# Patient Record
Sex: Female | Born: 1960 | Race: White | Hispanic: No | Marital: Married | Smoking: Never smoker
Health system: Southern US, Community
[De-identification: ages and names within clinical notes are randomized; demographics above are authoritative.]

## PROBLEM LIST (undated history)

## (undated) DIAGNOSIS — T7840XA Allergy, unspecified, initial encounter: Secondary | ICD-10-CM

## (undated) DIAGNOSIS — R921 Mammographic calcification found on diagnostic imaging of breast: Secondary | ICD-10-CM

## (undated) DIAGNOSIS — H269 Unspecified cataract: Secondary | ICD-10-CM

## (undated) DIAGNOSIS — R011 Cardiac murmur, unspecified: Secondary | ICD-10-CM

## (undated) DIAGNOSIS — I341 Nonrheumatic mitral (valve) prolapse: Secondary | ICD-10-CM

## (undated) DIAGNOSIS — K219 Gastro-esophageal reflux disease without esophagitis: Secondary | ICD-10-CM

## (undated) DIAGNOSIS — H409 Unspecified glaucoma: Secondary | ICD-10-CM

## (undated) DIAGNOSIS — I471 Supraventricular tachycardia: Secondary | ICD-10-CM

## (undated) HISTORY — DX: Supraventricular tachycardia: I47.1

## (undated) HISTORY — PX: CATARACT EXTRACTION: SUR2

## (undated) HISTORY — PX: EYE SURGERY: SHX253

## (undated) HISTORY — DX: Allergy, unspecified, initial encounter: T78.40XA

## (undated) HISTORY — DX: Gastro-esophageal reflux disease without esophagitis: K21.9

## (undated) HISTORY — DX: Cardiac murmur, unspecified: R01.1

## (undated) HISTORY — DX: Nonrheumatic mitral (valve) prolapse: I34.1

## (undated) HISTORY — DX: Mammographic calcification found on diagnostic imaging of breast: R92.1

## (undated) HISTORY — DX: Unspecified glaucoma: H40.9

## (undated) HISTORY — PX: BREAST SURGERY: SHX581

## (undated) HISTORY — DX: Unspecified cataract: H26.9

---

## 1988-05-20 HISTORY — PX: CHOLECYSTECTOMY: SHX55

## 1990-05-20 HISTORY — PX: WISDOM TOOTH EXTRACTION: SHX21

## 2003-04-27 ENCOUNTER — Encounter: Admission: RE | Admit: 2003-04-27 | Discharge: 2003-04-27 | Payer: Self-pay | Admitting: Family Medicine

## 2003-06-17 ENCOUNTER — Other Ambulatory Visit: Admission: RE | Admit: 2003-06-17 | Discharge: 2003-06-17 | Payer: Self-pay | Admitting: Family Medicine

## 2006-01-30 ENCOUNTER — Other Ambulatory Visit: Admission: RE | Admit: 2006-01-30 | Discharge: 2006-01-30 | Payer: Self-pay | Admitting: Family Medicine

## 2007-06-08 ENCOUNTER — Encounter: Admission: RE | Admit: 2007-06-08 | Discharge: 2007-06-08 | Payer: Self-pay | Admitting: Family Medicine

## 2010-05-23 ENCOUNTER — Other Ambulatory Visit
Admission: RE | Admit: 2010-05-23 | Discharge: 2010-05-23 | Payer: Self-pay | Source: Home / Self Care | Admitting: Family Medicine

## 2012-05-25 ENCOUNTER — Other Ambulatory Visit: Payer: Self-pay | Admitting: Family Medicine

## 2012-05-25 DIAGNOSIS — Z1231 Encounter for screening mammogram for malignant neoplasm of breast: Secondary | ICD-10-CM

## 2012-05-27 ENCOUNTER — Ambulatory Visit
Admission: RE | Admit: 2012-05-27 | Discharge: 2012-05-27 | Disposition: A | Discharge status: Home / Self Care | Payer: BC Managed Care – PPO | Source: Ambulatory Visit | Attending: Family Medicine | Admitting: Family Medicine

## 2012-05-27 DIAGNOSIS — Z1231 Encounter for screening mammogram for malignant neoplasm of breast: Secondary | ICD-10-CM

## 2012-05-28 ENCOUNTER — Other Ambulatory Visit: Payer: Self-pay | Admitting: Family Medicine

## 2012-05-28 DIAGNOSIS — R928 Other abnormal and inconclusive findings on diagnostic imaging of breast: Secondary | ICD-10-CM

## 2012-06-10 ENCOUNTER — Ambulatory Visit
Admission: RE | Admit: 2012-06-10 | Discharge: 2012-06-10 | Disposition: A | Discharge status: Home / Self Care | Payer: BC Managed Care – PPO | Source: Ambulatory Visit | Attending: Family Medicine | Admitting: Family Medicine

## 2012-06-10 DIAGNOSIS — R928 Other abnormal and inconclusive findings on diagnostic imaging of breast: Secondary | ICD-10-CM

## 2012-06-17 ENCOUNTER — Ambulatory Visit (INDEPENDENT_AMBULATORY_CARE_PROVIDER_SITE_OTHER): Payer: BC Managed Care – PPO | Admitting: Surgery

## 2012-06-17 ENCOUNTER — Encounter (INDEPENDENT_AMBULATORY_CARE_PROVIDER_SITE_OTHER): Payer: Self-pay | Admitting: Surgery

## 2012-06-17 VITALS — BP 128/74 | HR 80 | Temp 98.2°F | Resp 14 | Ht 65.5 in | Wt 109.0 lb

## 2012-06-17 DIAGNOSIS — R921 Mammographic calcification found on diagnostic imaging of breast: Secondary | ICD-10-CM

## 2012-06-17 DIAGNOSIS — R928 Other abnormal and inconclusive findings on diagnostic imaging of breast: Secondary | ICD-10-CM

## 2012-06-17 HISTORY — DX: Mammographic calcification found on diagnostic imaging of breast: R92.1

## 2012-06-17 NOTE — Progress Notes (Signed)
Patient ID: Heidi Aguilar, female   DOB: Jun 09, 1960, 52 y.o.   MRN: [id number]  Chief Complaint  Patient presents with  . Other    breast calcifications    HPI Heidi Aguilar is a 52 y.o. female.   HPI She is referred by Dr. Mila Palmer For evaluation of abnormal calcifications in the left breast. These were found on recent screening mammography. A stereotactic biopsy could not be performed because of the location. She has had no previous problems with her breasts and has had no history of biopsies. She denies nipple discharge. She is otherwise without complaints. There is no family history of breast cancer Past Medical History  Diagnosis Date  . GERD (gastroesophageal reflux disease)   . Breast calcifications     left breast  . MVP (mitral valve prolapse)     Past Surgical History  Procedure Date  . Cataract extraction     lt eye  . Cholecystectomy 1990  . Wisdom tooth extraction 1992    Family History  Problem Relation Age of Onset  . Heart disease Father   . Cancer Maternal Uncle     prostate  . Cancer Maternal Grandmother     pancreatic  . Cancer Paternal Grandfather     throat    Social History History  Substance Use Topics  . Smoking status: Never Smoker   . Smokeless tobacco: Not on file  . Alcohol Use: No    Allergies  Allergen Reactions  . Acetaminophen     Kidney pain and back pain  . Aspirin     GI discomfort  . Percocet (Oxycodone-Acetaminophen) Nausea Only    Current Outpatient Prescriptions  Medication Sig Dispense Refill  . Ascorbic Acid (VITAMIN C PO) Take by mouth.      . Cholecalciferol (VITAMIN D PO) Take by mouth.      . Coenzyme Q10 (COQ10 PO) Take by mouth.      . Multiple Vitamin (MULTIVITAMIN) capsule Take 1 capsule by mouth daily.      . ranitidine (ZANTAC) 75 MG tablet Take 75 mg by mouth 2 (two) times daily.      Satira Sark Johns Wort 150 MG CAPS Take by mouth.      . timolol (BETIMOL) 0.25 % ophthalmic solution 1-2 drops 2 (two)  times daily.      Marland Kitchen VITAMIN E PO Take by mouth.        Review of Systems Review of Systems  Constitutional: Positive for unexpected weight change. Negative for fever and chills.  HENT: Negative for hearing loss, congestion, sore throat, trouble swallowing and voice change.   Eyes: Negative for visual disturbance.  Respiratory: Negative for cough and wheezing.   Cardiovascular: Positive for palpitations. Negative for chest pain and leg swelling.  Gastrointestinal: Negative for nausea, vomiting, abdominal pain, diarrhea, constipation, blood in stool, abdominal distention and anal bleeding.  Genitourinary: Negative for hematuria, vaginal bleeding and difficulty urinating.  Musculoskeletal: Negative for arthralgias.  Skin: Negative for rash and wound.  Neurological: Negative for seizures, syncope and headaches.  Hematological: Negative for adenopathy. Does not bruise/bleed easily.  Psychiatric/Behavioral: Negative for confusion.    Blood pressure 128/74, pulse 80, temperature 98.2 F (36.8 C), temperature source Temporal, resp. rate 14, height 5' 5.5" (1.664 m), weight 109 lb (49.442 kg).  Physical Exam Physical Exam  Constitutional: She is oriented to person, place, and time. No distress.       Very thin  HENT:  Head: Normocephalic and atraumatic.  Right Ear: External ear normal.  Left Ear: External ear normal.  Nose: Nose normal.  Mouth/Throat: Oropharynx is clear and moist. No oropharyngeal exudate.  Eyes: Conjunctivae normal are normal. Pupils are equal, round, and reactive to light. Right eye exhibits no discharge. Left eye exhibits no discharge. No scleral icterus.  Neck: Normal range of motion. Neck supple. No tracheal deviation present.  Cardiovascular: Normal rate, regular rhythm and intact distal pulses.   Murmur heard. Pulmonary/Chest: Effort normal and breath sounds normal. No respiratory distress. She has no wheezes.  Abdominal: Soft. Bowel sounds are normal. She  exhibits no distension. There is no tenderness.       Well-healed right subcostal incision  Musculoskeletal: Normal range of motion. She exhibits no edema and no tenderness.  Lymphadenopathy:    She has no cervical adenopathy.    She has no axillary adenopathy.  Neurological: She is alert and oriented to person, place, and time.  Skin: Skin is warm and dry. No rash noted. She is not diaphoretic. No erythema.  Psychiatric: Her behavior is normal. Judgment normal.  Breasts: There are no palpable masses in either breast. Areola are normal.  Data Reviewed I have reviewed the mammograms demonstrating the abnormal calcifications in the upper outer quadrant of the left breast  Assessment    Abnormal calcifications left breast    Plan    Needle localized left breast lumpectomy is recommended. I have discussed this with her in detail including the reasoning for the biopsy. I discussed the risks of surgery which includes but is not limited to bleeding, infection, injury to surrounding structures, Need for further surgery should cancer be present, et Karie Soda.  She understands and wishes to proceed with surgery. Likelihood of success is good       Adianna Darwin A 06/17/2012, 9:29 AM

## 2012-06-29 ENCOUNTER — Encounter (HOSPITAL_BASED_OUTPATIENT_CLINIC_OR_DEPARTMENT_OTHER): Payer: Self-pay | Admitting: *Deleted

## 2012-07-01 ENCOUNTER — Encounter (HOSPITAL_BASED_OUTPATIENT_CLINIC_OR_DEPARTMENT_OTHER): Payer: Self-pay | Admitting: *Deleted

## 2012-07-02 ENCOUNTER — Ambulatory Visit (HOSPITAL_BASED_OUTPATIENT_CLINIC_OR_DEPARTMENT_OTHER): Admission: RE | Admit: 2012-07-02 | Payer: BC Managed Care – PPO | Source: Ambulatory Visit | Admitting: Surgery

## 2012-07-02 SURGERY — BREAST LUMPECTOMY WITH NEEDLE LOCALIZATION
Anesthesia: General | Site: Breast | Laterality: Left

## 2012-07-04 ENCOUNTER — Other Ambulatory Visit: Payer: Self-pay

## 2012-07-08 NOTE — Progress Notes (Signed)
Reviewed preop instructions

## 2012-07-13 ENCOUNTER — Encounter (HOSPITAL_BASED_OUTPATIENT_CLINIC_OR_DEPARTMENT_OTHER): Payer: Self-pay | Admitting: Anesthesiology

## 2012-07-13 ENCOUNTER — Other Ambulatory Visit (INDEPENDENT_AMBULATORY_CARE_PROVIDER_SITE_OTHER): Payer: Self-pay | Admitting: Surgery

## 2012-07-13 ENCOUNTER — Ambulatory Visit (HOSPITAL_BASED_OUTPATIENT_CLINIC_OR_DEPARTMENT_OTHER): Admission: RE | Admit: 2012-07-13 | Payer: BC Managed Care – PPO | Source: Ambulatory Visit | Admitting: Surgery

## 2012-07-13 ENCOUNTER — Ambulatory Visit
Admission: RE | Admit: 2012-07-13 | Discharge: 2012-07-13 | Disposition: A | Discharge status: Home / Self Care | Payer: BC Managed Care – PPO | Source: Ambulatory Visit | Attending: Surgery | Admitting: Surgery

## 2012-07-13 DIAGNOSIS — R921 Mammographic calcification found on diagnostic imaging of breast: Secondary | ICD-10-CM

## 2012-07-13 SURGERY — BREAST LUMPECTOMY WITH NEEDLE LOCALIZATION
Anesthesia: General | Laterality: Left

## 2012-07-13 NOTE — H&P (Signed)
Patient ID: Heidi Aguilar, female DOB: 07-19-1960, 52 y.o. MRN: [id number]  Chief Complaint   Patient presents with   .  Other     breast calcifications   HPI  Heidi Aguilar is a 52 y.o. female.  HPI  She is referred by Dr. Mila Palmer For evaluation of abnormal calcifications in the left breast. These were found on recent screening mammography. A stereotactic biopsy could not be performed because of the location. She has had no previous problems with her breasts and has had no history of biopsies. She denies nipple discharge. She is otherwise without complaints. There is no family history of breast cancer  Past Medical History   Diagnosis  Date   .  GERD (gastroesophageal reflux disease)    .  Breast calcifications      left breast   .  MVP (mitral valve prolapse)     Past Surgical History   Procedure  Date   .  Cataract extraction      lt eye   .  Cholecystectomy  1990   .  Wisdom tooth extraction  1992    Family History   Problem  Relation  Age of Onset   .  Heart disease  Father    .  Cancer  Maternal Uncle       prostate    .  Cancer  Maternal Grandmother       pancreatic    .  Cancer  Paternal Grandfather       throat   Social History  History   Substance Use Topics   .  Smoking status:  Never Smoker   .  Smokeless tobacco:  Not on file   .  Alcohol Use:  No    Allergies   Allergen  Reactions   .  Acetaminophen      Kidney pain and back pain   .  Aspirin      GI discomfort   .  Percocet (Oxycodone-Acetaminophen)  Nausea Only    Current Outpatient Prescriptions   Medication  Sig  Dispense  Refill   .  Ascorbic Acid (VITAMIN C PO)  Take by mouth.     .  Cholecalciferol (VITAMIN D PO)  Take by mouth.     .  Coenzyme Q10 (COQ10 PO)  Take by mouth.     .  Multiple Vitamin (MULTIVITAMIN) capsule  Take 1 capsule by mouth daily.     .  ranitidine (ZANTAC) 75 MG tablet  Take 75 mg by mouth 2 (two) times daily.     Satira Sark Johns Wort 150 MG CAPS  Take by  mouth.     .  timolol (BETIMOL) 0.25 % ophthalmic solution  1-2 drops 2 (two) times daily.     Marland Kitchen  VITAMIN E PO  Take by mouth.     Review of Systems  Review of Systems  Constitutional: Positive for unexpected weight change. Negative for fever and chills.  HENT: Negative for hearing loss, congestion, sore throat, trouble swallowing and voice change.  Eyes: Negative for visual disturbance.  Respiratory: Negative for cough and wheezing.  Cardiovascular: Positive for palpitations. Negative for chest pain and leg swelling.  Gastrointestinal: Negative for nausea, vomiting, abdominal pain, diarrhea, constipation, blood in stool, abdominal distention and anal bleeding.  Genitourinary: Negative for hematuria, vaginal bleeding and difficulty urinating.  Musculoskeletal: Negative for arthralgias.  Skin: Negative for rash and wound.  Neurological: Negative for seizures, syncope and headaches.  Hematological: Negative for adenopathy. Does not bruise/bleed easily.  Psychiatric/Behavioral: Negative for confusion.  Blood pressure 128/74, pulse 80, temperature 98.2 F (36.8 C), temperature source Temporal, resp. rate 14, height 5' 5.5" (1.664 m), weight 109 lb (49.442 kg).  Physical Exam  Physical Exam  Constitutional: She is oriented to person, place, and time. No distress.  Very thin  HENT:  Head: Normocephalic and atraumatic.  Right Ear: External ear normal.  Left Ear: External ear normal.  Nose: Nose normal.  Mouth/Throat: Oropharynx is clear and moist. No oropharyngeal exudate.  Eyes: Conjunctivae normal are normal. Pupils are equal, round, and reactive to light. Right eye exhibits no discharge. Left eye exhibits no discharge. No scleral icterus.  Neck: Normal range of motion. Neck supple. No tracheal deviation present.  Cardiovascular: Normal rate, regular rhythm and intact distal pulses.  Murmur heard.  Pulmonary/Chest: Effort normal and breath sounds normal. No respiratory distress. She has  no wheezes.  Abdominal: Soft. Bowel sounds are normal. She exhibits no distension. There is no tenderness.  Well-healed right subcostal incision  Musculoskeletal: Normal range of motion. She exhibits no edema and no tenderness.  Lymphadenopathy:  She has no cervical adenopathy.  She has no axillary adenopathy.  Neurological: She is alert and oriented to person, place, and time.  Skin: Skin is warm and dry. No rash noted. She is not diaphoretic. No erythema.  Psychiatric: Her behavior is normal. Judgment normal.  Breasts: There are no palpable masses in either breast. Areola are normal.  Data Reviewed  I have reviewed the mammograms demonstrating the abnormal calcifications in the upper outer quadrant of the left breast  Assessment  Abnormal calcifications left breast  Plan  Needle localized left breast lumpectomy is recommended. I have discussed this with her in detail including the reasoning for the biopsy. I discussed the risks of surgery which includes but is not limited to bleeding, infection, injury to surrounding structures, Need for further surgery should cancer be present, et Karie Soda. She understands and wishes to proceed with surgery. Likelihood of success is good

## 2012-07-13 NOTE — Consult Note (Signed)
  Lesion was actually able to have stereotactic biopsy today instead of needle loc so surgery is canceled

## 2012-07-14 ENCOUNTER — Telehealth (INDEPENDENT_AMBULATORY_CARE_PROVIDER_SITE_OTHER): Payer: Self-pay | Admitting: General Surgery

## 2012-07-14 NOTE — Telephone Encounter (Signed)
Spoke with patient , Patient states she did not need surgery no appt was  Made .

## 2013-03-25 ENCOUNTER — Other Ambulatory Visit: Payer: Self-pay

## 2013-06-14 ENCOUNTER — Other Ambulatory Visit: Payer: Self-pay | Admitting: Family Medicine

## 2013-06-14 ENCOUNTER — Other Ambulatory Visit (HOSPITAL_COMMUNITY)
Admission: RE | Admit: 2013-06-14 | Discharge: 2013-06-14 | Disposition: A | Discharge status: Home / Self Care | Payer: BC Managed Care – PPO | Source: Ambulatory Visit | Attending: Family Medicine | Admitting: Family Medicine

## 2013-06-14 DIAGNOSIS — Z124 Encounter for screening for malignant neoplasm of cervix: Secondary | ICD-10-CM | POA: Insufficient documentation

## 2015-10-02 ENCOUNTER — Other Ambulatory Visit: Payer: Self-pay

## 2015-10-02 DIAGNOSIS — Z1231 Encounter for screening mammogram for malignant neoplasm of breast: Secondary | ICD-10-CM

## 2015-10-12 ENCOUNTER — Ambulatory Visit
Admission: RE | Admit: 2015-10-12 | Discharge: 2015-10-12 | Disposition: A | Discharge status: Home / Self Care | Payer: BC Managed Care – PPO | Source: Ambulatory Visit

## 2015-10-12 DIAGNOSIS — Z1231 Encounter for screening mammogram for malignant neoplasm of breast: Secondary | ICD-10-CM

## 2017-01-06 ENCOUNTER — Other Ambulatory Visit (HOSPITAL_COMMUNITY)
Admission: RE | Admit: 2017-01-06 | Discharge: 2017-01-06 | Disposition: A | Discharge status: Home / Self Care | Payer: BC Managed Care – PPO | Source: Ambulatory Visit | Attending: Family Medicine | Admitting: Family Medicine

## 2017-01-06 ENCOUNTER — Other Ambulatory Visit: Payer: Self-pay | Admitting: Family Medicine

## 2017-01-06 DIAGNOSIS — Z01411 Encounter for gynecological examination (general) (routine) with abnormal findings: Secondary | ICD-10-CM | POA: Diagnosis not present

## 2017-01-07 LAB — CYTOLOGY - PAP
Diagnosis: NEGATIVE
HPV (WINDOPATH): NOT DETECTED

## 2017-01-26 ENCOUNTER — Emergency Department (HOSPITAL_COMMUNITY): Payer: BC Managed Care – PPO

## 2017-01-26 ENCOUNTER — Encounter (HOSPITAL_COMMUNITY): Payer: Self-pay | Admitting: *Deleted

## 2017-01-26 DIAGNOSIS — I341 Nonrheumatic mitral (valve) prolapse: Secondary | ICD-10-CM | POA: Diagnosis not present

## 2017-01-26 DIAGNOSIS — Z885 Allergy status to narcotic agent status: Secondary | ICD-10-CM | POA: Insufficient documentation

## 2017-01-26 DIAGNOSIS — R921 Mammographic calcification found on diagnostic imaging of breast: Secondary | ICD-10-CM | POA: Insufficient documentation

## 2017-01-26 DIAGNOSIS — I471 Supraventricular tachycardia: Principal | ICD-10-CM | POA: Insufficient documentation

## 2017-01-26 DIAGNOSIS — K219 Gastro-esophageal reflux disease without esophagitis: Secondary | ICD-10-CM | POA: Diagnosis not present

## 2017-01-26 DIAGNOSIS — I493 Ventricular premature depolarization: Secondary | ICD-10-CM | POA: Insufficient documentation

## 2017-01-26 DIAGNOSIS — R002 Palpitations: Secondary | ICD-10-CM | POA: Diagnosis present

## 2017-01-26 LAB — CBC
HCT: 40.4 % (ref 36.0–46.0)
HEMOGLOBIN: 13.2 g/dL (ref 12.0–15.0)
MCH: 31 pg (ref 26.0–34.0)
MCHC: 32.7 g/dL (ref 30.0–36.0)
MCV: 94.8 fL (ref 78.0–100.0)
PLATELETS: 189 10*3/uL (ref 150–400)
RBC: 4.26 MIL/uL (ref 3.87–5.11)
RDW: 12.5 % (ref 11.5–15.5)
WBC: 6.3 10*3/uL (ref 4.0–10.5)

## 2017-01-26 NOTE — ED Triage Notes (Signed)
The pt is c/o her heart racing earlier tonight and beating irregular.  No chest pain.  Hx of pvcs

## 2017-01-27 ENCOUNTER — Encounter (HOSPITAL_COMMUNITY): Payer: Self-pay | Admitting: Internal Medicine

## 2017-01-27 ENCOUNTER — Observation Stay (HOSPITAL_COMMUNITY)
Admission: EM | Admit: 2017-01-27 | Discharge: 2017-01-27 | Disposition: A | Discharge status: Home / Self Care | Payer: BC Managed Care – PPO | Attending: Internal Medicine | Admitting: Internal Medicine

## 2017-01-27 ENCOUNTER — Observation Stay (HOSPITAL_BASED_OUTPATIENT_CLINIC_OR_DEPARTMENT_OTHER): Payer: BC Managed Care – PPO

## 2017-01-27 ENCOUNTER — Other Ambulatory Visit: Payer: Self-pay

## 2017-01-27 DIAGNOSIS — I4719 Other supraventricular tachycardia: Secondary | ICD-10-CM

## 2017-01-27 DIAGNOSIS — R002 Palpitations: Secondary | ICD-10-CM

## 2017-01-27 DIAGNOSIS — I471 Supraventricular tachycardia: Secondary | ICD-10-CM

## 2017-01-27 HISTORY — DX: Other supraventricular tachycardia: I47.19

## 2017-01-27 HISTORY — DX: Supraventricular tachycardia: I47.1

## 2017-01-27 LAB — RAPID URINE DRUG SCREEN, HOSP PERFORMED
Amphetamines: NOT DETECTED
BARBITURATES: NOT DETECTED
Benzodiazepines: NOT DETECTED
Cocaine: NOT DETECTED
Opiates: NOT DETECTED
TETRAHYDROCANNABINOL: NOT DETECTED

## 2017-01-27 LAB — ECHOCARDIOGRAM COMPLETE
HEIGHTINCHES: 65.5 in
Weight: 1791.9 oz

## 2017-01-27 LAB — BASIC METABOLIC PANEL
Anion gap: 12 (ref 5–15)
BUN: 17 mg/dL (ref 6–20)
CHLORIDE: 100 mmol/L — AB (ref 101–111)
CO2: 24 mmol/L (ref 22–32)
Calcium: 9.5 mg/dL (ref 8.9–10.3)
Creatinine, Ser: 0.87 mg/dL (ref 0.44–1.00)
GFR calc Af Amer: >60 mL/min (ref >60)
GFR calc non Af Amer: >60 mL/min (ref >60)
GLUCOSE: 103 mg/dL — AB (ref 65–99)
POTASSIUM: 3.7 mmol/L (ref 3.5–5.1)
Sodium: 136 mmol/L (ref 135–145)

## 2017-01-27 LAB — MAGNESIUM: Magnesium: 2 mg/dL (ref 1.7–2.4)

## 2017-01-27 LAB — HIV ANTIBODY (ROUTINE TESTING W REFLEX): HIV Screen 4th Generation wRfx: NONREACTIVE

## 2017-01-27 LAB — TROPONIN I

## 2017-01-27 MED ORDER — HEPARIN SODIUM (PORCINE) 5000 UNIT/ML IJ SOLN: 5000.0000 [IU] | Freq: Three times a day (TID) | INTRAMUSCULAR | Status: DC

## 2017-01-27 MED ORDER — METOPROLOL TARTRATE 5 MG/5ML IV SOLN
5.0000 mg | Freq: Once | INTRAVENOUS | Status: AC
  Administered 2017-01-27: 5 mg via INTRAVENOUS
  Filled 2017-01-27: qty 5

## 2017-01-27 MED ORDER — ONDANSETRON HCL 4 MG/2ML IJ SOLN: 4.0000 mg | Freq: Four times a day (QID) | INTRAMUSCULAR | Status: DC | PRN

## 2017-01-27 MED ORDER — ONDANSETRON HCL 4 MG PO TABS: 4.0000 mg | ORAL_TABLET | Freq: Four times a day (QID) | ORAL | Status: DC | PRN

## 2017-01-27 NOTE — Progress Notes (Signed)
Patient admitted after midnight, please see H&P.  I have fixed patient's social history as she DOES NOT use cocaine.  Await cards consult.  Patient anxious to be d/c'd home as she is in observation status.  Marlin CanaryJessica Jetaime Pinnix DO

## 2017-01-27 NOTE — ED Provider Notes (Signed)
          Derwood KaplanNanavati, Nellene Courtois, MD 01/30/17 1743

## 2017-01-27 NOTE — Discharge Summary (Signed)
Physician Discharge Summary  Heidi Aguilar ZOX:[id number] DOB: 03/09/1961 DOA: 01/27/2017  PCP: Mila Palmer, MD  Admit date: 01/27/2017 Discharge date: 01/27/2017   Recommendations for Outpatient Follow-Up:   1. Outpatient EP follow up set up-- will need eval for marfans 2. Will need script for lopressor 25 mg q 6 hours PRN (saw recommendations after d/c)   Discharge Diagnosis:   Principal Problem:   Multifocal atrial tachycardia [facility])   Discharge disposition:  Home.  Discharge Condition: Improved.  Diet recommendation: Low sodium, heart healthy  Wound care: None.   History of Present Illness:   Heidi Aguilar is a 56 y.o. female with medical history significant of mild MVP back some 10 years ago.  About 2 years ago patient developed occasional palpitations.  She went to her PCP who saw a unifocal PVC on an EKG and she thought that's all she had going on.  Tonight however, she developed continuous palpitations while watching TV.  These persisted much more than they had in the past.  She presents to the ED wit palpitations.   Hospital Course by Problem:   Atrial Tach, -lopressor  q6 PRN for palpitations -echo: Left ventricle: The cavity size was normal. Wall thickness was   normal. Systolic function was normal. The estimated ejection   fraction was in the range of 60% to 65%. 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 Calcified annulus. -EP follow up   Medical Consultants:    EP   Discharge Exam:   Vitals:   01/27/17 0800 01/27/17 1442  BP: 116/66 110/65  Pulse: 72 80  Resp: 16 16  Temp: 98.4 F (36.9 C) 98.8 F (37.1 C)  SpO2: 100% 96%   Vitals:   01/27/17 0629 01/27/17 0630 01/27/17 0800 01/27/17 1442  BP: 129/66 117/71 116/66 110/65  Pulse: 70 69 72 80  Resp: Temp:   98.4 F (36.9 C) 98.8 F (37.1 C)  TempSrc:   Oral Oral  SpO2: 100% 98% 100% 96%  Weight:   50.8 kg (111 lb 15.9 oz)     Height:   5' 5.5" (1.664 m)     Gen:  NAD- anxious to be d/c'd   The results of significant diagnostics from this hospitalization (including imaging, microbiology, ancillary and laboratory) are listed below for reference.     Procedures and Diagnostic Studies:   No results found.   Labs:   Basic Metabolic Panel:  Recent Labs Lab 01/26/17 2330 01/27/17 1057  NA 136  --   K 3.7  --   CL 100*  --   CO2 24  --   GLUCOSE 103*  --   BUN 17  --   CREATININE 0.87  --   CALCIUM 9.5  --   MG  --  2.0   GFR Estimated Creatinine Clearance: 57.2 mL/min (by C-G formula based on SCr of 0.87 mg/dL). Liver Function Tests: No results for input(s): AST, ALT, ALKPHOS, BILITOT, PROT, ALBUMIN in the last 168 hours. No results for input(s): LIPASE, AMYLASE in the last 168 hours. No results for input(s): AMMONIA in the last 168 hours. Coagulation profile No results for input(s): INR, PROTIME in the last 168 hours.  CBC:  Recent Labs Lab 01/26/17 2330  WBC 6.3  HGB 13.2  HCT 40.4  MCV 94.8  PLT 189   Cardiac Enzymes:  Recent Labs Lab 01/26/17 2330  TROPONINI <0.03   BNP: Invalid input(s): POCBNP CBG:  No results for input(s): GLUCAP in the last 168 hours. D-Dimer No results for input(s): DDIMER in the last 72 hours. Hgb A1c No results for input(s): HGBA1C in the last 72 hours. Lipid Profile No results for input(s): CHOL, HDL, LDLCALC, TRIG, CHOLHDL, LDLDIRECT in the last 72 hours. Thyroid function studies No results for input(s): TSH, T4TOTAL, T3FREE, THYROIDAB in the last 72 hours.  Invalid input(s): FREET3 Anemia work up No results for input(s): VITAMINB12, FOLATE, FERRITIN, TIBC, IRON, RETICCTPCT in the last 72 hours. Microbiology No results found for this or any previous visit (from the past 240 hour(s)).   Discharge Instructions:   Discharge Instructions    Diet - low sodium heart healthy    Complete by:  As directed    Increase activity slowly     Complete by:  As directed      Allergies as of 01/27/2017      Reactions   Acetaminophen Other (See Comments)   Kidney pain and back pain   Aspirin Other (See Comments)   GI discomfort   Percocet [oxycodone-acetaminophen] Nausea Only   Cold sweats   Vicodin [hydrocodone-acetaminophen] Other (See Comments)   Bad dreams      Medication List    TAKE these medications   multivitamin capsule Take 1 capsule by mouth daily.   Omega-3 1000 MG Caps Take 1,000 mg by mouth daily.   PROBIOTIC PO Take 1 capsule by mouth daily.   VITAMIN D PO Take by mouth.            Discharge Care Instructions        Start     Ordered   01/27/17 0000  Increase activity slowly     01/27/17 1724   01/27/17 0000  Diet - low sodium heart healthy     01/27/17 1724     Follow-up Information    Sheilah PigeonUrsuy, Renee Lynn, PA-C Follow up on 02/21/2017.   Specialty:  Cardiology Why:  10:30AM Contact information: [address] [place] [address] [phone]        Mila PalmerWolters, Sharon, MD Follow up in 1 week(s).   Specialty:  Family Medicine Contact information: [address] [place] [address] [phone]            Time coordinating discharge: 35 min  Signed:  Joseph ArtJESSICA U Lela Murfin   [facility] 01/27/2017, 5:24 PM

## 2017-01-27 NOTE — Consult Note (Signed)
Cardiology Consultation:   Patient ID: Heidi Aguilar; [id number]; July 11, 1960   Admit date: 01/27/2017 Date of Consult: 01/27/2017  Primary Care Provider: Mila Palmer, MD Primary Cardiologist: new to [facility]  Patient Profile:   Heidi Aguilar. Schurman is a 56 y.o. female with a hx of GERD, reports MVP, no other known cardiac history or history at all who is being seen today for the evaluation of palpitations at the request of Dr. Benjamine Mola.  History of Present Illness:   Ms. Heidi Aguilar reports 4-5 years of occ palpitations/skipped beat type feelings she has been told were PVCs (by her PMD) and of no concern, they were infrequent and not bothersome to her and nothing more was done.  She reports since her teens being told she had MVP and has had a few echos over the years, always only mentioning MVP without any other abnormalities, the last perhaps 10 years or so ago.  She has never seen a cardiologist, and have been done via her PMD service.  Last night she developed unusual palpitations that were brief but more of a fluttering sensation with pressure on the sides of her neck when they would occur.  No CP or SOB, no dizziness, near syncope or syncope.  They persisted, always brief only seconds but recurrently, these were making her feel anxious and after about 45 minutes decided to get checked out.   LABS K+ 3.7 BUN/Creat 17/0.87 Trop I <0.03 WBC 6.3 H/H 13/40 plts 189  The patient was able to access her [place] records, showing me a TSH drawn 01/06/17 was 1.81  VSS   She was treated with  IV Lopressor in the ER, she did deep breathing exercises and tried to relax and has felt well since  She does not smoke, drink, no snoring or daytime fatigue/sleepiness, she exercises very routinely at least 30-31minutes most days, walking and aerobic type exercises with good exertional capacity, unchanged for years.  Past Medical History:  Diagnosis Date  . Breast calcifications    left breast  . GERD  (gastroesophageal reflux disease)   . MVP (mitral valve prolapse)     Past Surgical History:  Procedure Laterality Date  . CATARACT EXTRACTION     lt eye  . CHOLECYSTECTOMY  1990  . EYE SURGERY     lt eye age 21 and 6 cong cataracts  . WISDOM TOOTH EXTRACTION  1992       Inpatient Medications: Scheduled Meds: . heparin  5,000 Units Subcutaneous Q8H   Continuous Infusions:  PRN Meds: ondansetron **OR** ondansetron (ZOFRAN) IV  Allergies:    Allergies  Allergen Reactions  . Acetaminophen Other (See Comments)    Kidney pain and back pain  . Aspirin Other (See Comments)    GI discomfort  . Percocet [Oxycodone-Acetaminophen] Nausea Only    Cold sweats  . Vicodin [Hydrocodone-Acetaminophen] Other (See Comments)    Bad dreams    Social History:   Social History   Social History  . Marital status: Married    Spouse name: N/A  . Number of children: N/A  . Years of education: N/A   Occupational History  . Not on file.   Social History Main Topics  . Smoking status: Never Smoker  . Smokeless tobacco: Never Used  . Alcohol use No  . Drug use: No  . Sexual activity: Not on file   Other Topics Concern  . Not on file   Social History Narrative  . No narrative on file  Family History:    Family History  Problem Relation Age of Onset  . Heart disease Father   . Cancer Maternal Uncle        prostate  . Cancer Maternal Grandmother        pancreatic  . Cancer Paternal Grandfather        throat     ROS:  Please see the history of present illness.  ROS  All other ROS reviewed and negative.     Physical Exam/Data:   Vitals:   01/27/17 0459 01/27/17 0629 01/27/17 0630 01/27/17 0800  BP:  129/66 117/71 116/66  Pulse: 93 70 69 72  Resp: 12 11 12 16   Temp:    98.4 F (36.9 C)  TempSrc:    Oral  SpO2: 100% 100% 98% 100%  Weight:    111 lb 15.9 oz (50.8 kg)  Height:    5' 5.5" (1.664 m)   No intake or output data in the 24 hours ending 01/27/17  1151 Filed Weights   01/26/17 2328 01/27/17 0800  Weight: 118 lb (53.5 kg) 111 lb 15.9 oz (50.8 kg)   Body mass index is 18.35 kg/m.  General:  Well nourished, well developed, in no acute distress HEENT: normal Lymph: no adenopathy Neck: no JVD Endocrine:  No thryomegaly Vascular: No carotid bruits; FA pulses 2+ bilaterally without bruits  Cardiac:   RRR; no murmurs, gallops or rubs Lungs:  CTA b/l, no wheezing, rhonchi or rales  Abd: soft, nontende Ext: no edema Musculoskeletal:  No deformities, BUE and BLE strength normal and equal Skin: warm and dry  Neuro:  no focal abnormalities noted Psych:  Normal affect   EKG:  The EKG was personally reviewed and demonstrates:   SR 89bpm, PR 184ms, QRS 84ms, QTc 469ms, PACs, PVC Telemetry:  Telemetry was personally reviewed and demonstrates:   SR 70's-80's, brier episodes of tachycardi, anywhere from 3seconds to 10 seconds appears to be aflutter, atypical, at thimes more like an ATachycardia  Relevant CV Studies: Echo is ordered  Laboratory Data:  Chemistry Recent Labs Lab 01/26/17 2330  NA 136  K 3.7  CL 100*  CO2 24  GLUCOSE 103*  BUN 17  CREATININE 0.87  CALCIUM 9.5  GFRNONAA >60  GFRAA >60  ANIONGAP 12    No results for input(s): PROT, ALBUMIN, AST, ALT, ALKPHOS, BILITOT in the last 168 hours. Hematology Recent Labs Lab 01/26/17 2330  WBC 6.3  RBC 4.26  HGB 13.2  HCT 40.4  MCV 94.8  MCH 31.0  MCHC 32.7  RDW 12.5  PLT 189   Cardiac Enzymes Recent Labs Lab 01/26/17 2330  TROPONINI <0.03   No results for input(s): TROPIPOC in the last 168 hours.  BNPNo results for input(s): BNP, PROBNP in the last 168 hours.  DDimer No results for input(s): DDIMER in the last 168 hours.  Radiology/Studies:  Dg Chest 2 View Result Date: 01/26/2017 CLINICAL DATA:  Heart palpitations today. EXAM: CHEST  2 VIEW COMPARISON:  None. FINDINGS: The lungs are clear. Heart size is normal. There is no pneumothorax or pleural  fluid. No acute bony abnormality. Cholecystectomy clips noted. IMPRESSION: No acute disease. Electronically Signed   By: Drusilla Kannerhomas  Dalessio M.D.   On: 01/26/2017 23:48    Assessment and Plan:   1. Palpitations     I suspect  an ATach     CHA2DS2Vasc is 1 for female gender should it be fel to be a flutter once reviewed with Dr. Graciela HusbandsKlein  Await echo    Dr. Graciela Husbands has seen the patient, is ATach, after d/w the patient, will plan for lopressor  q6 PRN for palpitations, would be OK from EP standpoint to discharge after her echo is completed here.  EP follow up has been arranged.    For questions or updates, please contact [facility] Please consult [email or web address] for contact info under Cardiology/STEMI. Daytime calls, contact the Day Call APP (6a-8a) or assigned team (Teams A-D) provider (7:30a - 5p). All other daytime calls (7:30-5p), contact the Card Master @ [phone].   Nighttime calls, contact the assigned APP (5p-8p) or MD (6:30p-8p). Overnight calls (8p-6a), contact the on call Fellow @ [phone].   Signed, Sheilah Pigeon, PA-C  01/27/2017 11:51 AM   The patient has a long-standing history of palpitations. She was diagnosed with mitral valve prolapse in 1977. At that time she was also noted to have a pectus deformity. Palpitations were subsequently clarified as PVCs.  She's had some irregular flutters but last night developed incessant irregular flutters the likes of which she had never had.  She has some neck discomfort with it but no lightheadedness chest pain or shortness of breath. Upon arrival in the emergency room she received beta blockers and overtime things settled and they have returned now towards normal.  TSH was normal 8/18.   She does not use caffeine. She does not snore.  Her past history is also notable for congenital cataract? She denies a history of dislocated lenses.  Her physical examination is noted for arachnodactyly, a pectus deformity, joint  hypermobility.  ECGs were reviewed demonstrating short runs of nonsustained atrial tachycardia and infrequent interpolated PVC  Impression Atrial tachycardia  Arachnodactyly with joint hypermobility  Mitral valve prolapse by history  Pectus deformity.  Congenital cataracts    The patient has atrial tachycardia. At this juncture she is disinclined to undertake long-term therapy. As it is thus far been episodic we will support her idea of episodic therapy and would prescribe beta blockers for her to take as needed. In the event the frequency dictates alternative strategies, we have reviewed long-term beta blocker therapy as an option, antiarrhythmic drug therapy and catheter ablation. He is to be determined in follow-up.  Another issue is as to whether there has been any impact of her tachycardia on cardiac function. We will undertake an echocardiogram. We will also need to be able to well visualize her mitral valve but her aorta. The event that is not well visualize she will need more intense imaging with CT scanning or MRI scanning to look at this in the context of her other musculoskeletal deformities. We have noted also that in the event that the aortic dilatation were noted prescribe long-term beta blockers for aortic protection as well as secondary benefit on her atrial arrhythmia  We will need to further clarify the constellation of her musculoskeletal defects, specifically looking to exclude Marfan syndrome

## 2017-01-27 NOTE — H&P (Signed)
History and Physical    Marzella L. Aina ZOX:[id number] DOB: Sep 23, 1960 DOA: 01/27/2017  PCP: Mila Palmer, MD  Patient coming from: Home  I have personally briefly reviewed patient's old medical records in [facility] [address] [facility]  Chief Complaint: Palpitations  HPI: Vermell L. Schmall is a 56 y.o. female with medical history significant of mild MVP back some 10 years ago.  About 2 years ago patient developed occasional palpitations.  She went to her PCP who saw a unifocal PVC on an EKG and she thought that's all she had going on.  Tonight however, she developed continuous palpitations while watching TV.  These persisted much more than they had in the past.  She presents to the ED wit palpitations.   ED Course:       Resolved spontaneously and patient now in NSR.   Review of Systems: As per HPI otherwise 10 point review of systems negative.   Past Medical History:  Diagnosis Date  . Breast calcifications    left breast  . GERD (gastroesophageal reflux disease)   . MVP (mitral valve prolapse)     Past Surgical History:  Procedure Laterality Date  . CATARACT EXTRACTION     lt eye  . CHOLECYSTECTOMY  1990  . EYE SURGERY     lt eye age 42 and 6 cong cataracts  . WISDOM TOOTH EXTRACTION  1992     reports that she has never smoked. She has never used smokeless tobacco. She reports that she uses drugs, including Benzodiazepines and Cocaine. She reports that she does not drink alcohol.  Allergies  Allergen Reactions  . Acetaminophen Other (See Comments)    Kidney pain and back pain  . Aspirin Other (See Comments)    GI discomfort  . Percocet [Oxycodone-Acetaminophen] Nausea Only    Cold sweats  . Vicodin [Hydrocodone-Acetaminophen] Other (See Comments)    Bad dreams    Family History  Problem Relation Age of Onset  . Heart disease Father   . Cancer Maternal Uncle        prostate  . Cancer Maternal Grandmother        pancreatic  . Cancer Paternal Grandfather    throat     Prior to Admission medications   Medication Sig Start Date End Date Taking? Authorizing Provider  Cholecalciferol (VITAMIN D PO) Take by mouth.   Yes [provider]  Multiple Vitamin (MULTIVITAMIN) capsule Take 1 capsule by mouth daily.   Yes [provider]  Omega-3 1000 MG CAPS Take 1,000 mg by mouth daily.   Yes [provider]  Probiotic Product (PROBIOTIC PO) Take 1 capsule by mouth daily.   Yes [provider]    Physical Exam: Vitals:   01/27/17 0456 01/27/17 0457 01/27/17 0458 01/27/17 0459  BP:      Pulse: 91 93 94 93  Resp: Temp:      TempSrc:      SpO2: 99% 100% 99% 100%  Weight:      Height:        Constitutional: NAD, calm, comfortable Eyes: PERRL, lids and conjunctivae normal ENMT: Mucous membranes are moist. Posterior pharynx clear of any exudate or lesions.Normal dentition.  Neck: normal, supple, no masses, no thyromegaly Respiratory: clear to auscultation bilaterally, no wheezing, no crackles. Normal respiratory effort. No accessory muscle use.  Cardiovascular: Regular rate and rhythm, no murmurs / rubs / gallops. No extremity edema. 2+ pedal pulses. No carotid bruits.  Abdomen: no tenderness, no  masses palpated. No hepatosplenomegaly. Bowel sounds positive.  Musculoskeletal: no clubbing / cyanosis. No joint deformity upper and lower extremities. Good ROM, no contractures. Normal muscle tone.  Skin: no rashes, lesions, ulcers. No induration Neurologic: CN 2-12 grossly intact. Sensation intact, DTR normal. Strength 5/5 in all 4.  Psychiatric: Normal judgment and insight. Alert and oriented x 3. Normal mood.    Labs on Admission: I have personally reviewed following labs and imaging studies  CBC:  Recent Labs Lab 01/26/17 2330  WBC 6.3  HGB 13.2  HCT 40.4  MCV 94.8  PLT 189   Basic Metabolic Panel:  Recent Labs Lab 01/26/17 2330  NA 136  K 3.7  CL 100*  CO2 24  GLUCOSE 103*  BUN 17   CREATININE 0.87  CALCIUM 9.5   GFR: Estimated Creatinine Clearance: 60.3 mL/min (by C-G formula based on SCr of 0.87 mg/dL). Liver Function Tests: No results for input(s): AST, ALT, ALKPHOS, BILITOT, PROT, ALBUMIN in the last 168 hours. No results for input(s): LIPASE, AMYLASE in the last 168 hours. No results for input(s): AMMONIA in the last 168 hours. Coagulation Profile: No results for input(s): INR, PROTIME in the last 168 hours. Cardiac Enzymes:  Recent Labs Lab 01/26/17 2330  TROPONINI <0.03   BNP (last 3 results) No results for input(s): PROBNP in the last 8760 hours. HbA1C: No results for input(s): HGBA1C in the last 72 hours. CBG: No results for input(s): GLUCAP in the last 168 hours. Lipid Profile: No results for input(s): CHOL, HDL, LDLCALC, TRIG, CHOLHDL, LDLDIRECT in the last 72 hours. Thyroid Function Tests: No results for input(s): TSH, T4TOTAL, FREET4, T3FREE, THYROIDAB in the last 72 hours. Anemia Panel: No results for input(s): VITAMINB12, FOLATE, FERRITIN, TIBC, IRON, RETICCTPCT in the last 72 hours. Urine analysis: No results found for: COLORURINE, APPEARANCEUR, LABSPEC, PHURINE, GLUCOSEU, HGBUR, BILIRUBINUR, KETONESUR, PROTEINUR, UROBILINOGEN, NITRITE, LEUKOCYTESUR  Radiological Exams on Admission: Dg Chest 2 View  Result Date: 01/26/2017 CLINICAL DATA:  Heart palpitations today. EXAM: CHEST  2 VIEW COMPARISON:  None. FINDINGS: The lungs are clear. Heart size is normal. There is no pneumothorax or pleural fluid. No acute bony abnormality. Cholecystectomy clips noted. IMPRESSION: No acute disease. Electronically Signed   By: Drusilla Kannerhomas  Dalessio M.D.   On: 01/26/2017 23:48    EKG: Independently reviewed.  Assessment/Plan Principal Problem:   Multifocal atrial tachycardia (HCC)    1. EKG abnormality - Dr. Virgina OrganQureshi feels that this represents Multifocal atrial tachycardia - actually looks like shes going rapidly in and out of flutter 1. Resolved at this  time and she is in NSR and asymptomatic 2. However will get patient admitted for obs and cards consult.  DVT prophylaxis: Lovenox Code Status: Full Family Communication: Husband at bedside Disposition Plan: Home after admit Consults called: EDP spoke with Cards, cards consult in AM Admission status: Place in obs   Hillary BowGARDNER, Laurann Mcmorris M. DO [facility] Pager [phone]  If 7AM-7PM, please contact day team taking care of patient [email or web address] Password [email or web address]  01/27/2017, 6:15 AM

## 2017-01-27 NOTE — ED Provider Notes (Signed)
[facility]-EMERGENCY DEPT Provider Note   CSN: [id number] Arrival date & time: 01/26/17  2319     History   Chief Complaint Chief Complaint  Patient presents with  . Palpitations    HPI Heidi Aguilar is a 56 y.o. female.  Patient presents to the emergency department with chief complaint of palpitations. She states that the palpitations began this evening at 10:30 PM. She states she was at home resting watching TV. She reports that she has had PVCs in the past, but does not experience these regularly. She was seen by cardiologist about 10 years ago and diagnosed with mild mitral valve prolapse, she has not seen a cardiologist since. She reports tonight that she has only had the palpitations sensation. She denies any chest pain chest tightness, or shortness of breath. She has not taken anything for her symptoms. There are no other associated symptoms. There are no modifying factors. She denies having used any stimulants today.   The history is provided by the patient. No language interpreter was used.    Past Medical History:  Diagnosis Date  . Breast calcifications    left breast  . GERD (gastroesophageal reflux disease)   . MVP (mitral valve prolapse)     Patient Active Problem List   Diagnosis Date Noted  . Breast calcification, left 06/17/2012    Past Surgical History:  Procedure Laterality Date  . CATARACT EXTRACTION     lt eye  . CHOLECYSTECTOMY  1990  . EYE SURGERY     lt eye age 89 and 6 cong cataracts  . WISDOM TOOTH EXTRACTION  1992    OB History    No data available       Home Medications    Prior to Admission medications   Medication Sig Start Date End Date Taking? Authorizing Provider  Ascorbic Acid (VITAMIN C PO) Take by mouth.    [provider]  Cholecalciferol (VITAMIN D PO) Take by mouth.    [provider]  Coenzyme Q10 (COQ10 PO) Take by mouth.    [provider]  Multiple Vitamin (MULTIVITAMIN) capsule Take 1 capsule  by mouth daily.    [provider]  ranitidine (ZANTAC) 75 MG tablet Take 75 mg by mouth 2 (two) times daily.    [provider]  Spectrum Health Blodgett Campust Johns Wort 150 MG CAPS Take by mouth.    [provider]  timolol (BETIMOL) 0.25 % ophthalmic solution 1-2 drops 2 (two) times daily.    [provider]  VITAMIN E PO Take by mouth.    [provider]    Family History Family History  Problem Relation Age of Onset  . Heart disease Father   . Cancer Maternal Uncle        prostate  . Cancer Maternal Grandmother        pancreatic  . Cancer Paternal Grandfather        throat    Social History Social History  Substance Use Topics  . Smoking status: Never Smoker  . Smokeless tobacco: Never Used  . Alcohol use No     Allergies   Acetaminophen; Aspirin; and Percocet [oxycodone-acetaminophen]   Review of Systems Review of Systems  All other systems reviewed and are negative.    Physical Exam Updated Vital Signs BP 127/87   Pulse 99   Temp 97.9 F (36.6 C) (Oral)   Resp (!) 21   Ht 5' 5.5" (1.664 m)   Wt 53.5 kg (118 lb)  SpO2 100%   BMI 19.34 kg/m   Physical Exam  Constitutional: She is oriented to person, place, and time. She appears well-developed and well-nourished.  HENT:  Head: Normocephalic and atraumatic.  Eyes: Pupils are equal, round, and reactive to light. Conjunctivae and EOM are normal.  Neck: Normal range of motion. Neck supple.  Cardiovascular: Exam reveals no gallop and no friction rub.   No murmur heard. Irregularly irregular  Pulmonary/Chest: Effort normal and breath sounds normal. No respiratory distress. She has no wheezes. She has no rales. She exhibits no tenderness.  Abdominal: Soft. Bowel sounds are normal. She exhibits no distension and no mass. There is no tenderness. There is no rebound and no guarding.  Musculoskeletal: Normal range of motion. She exhibits no edema or tenderness.  Neurological: She is alert and  oriented to person, place, and time.  Skin: Skin is warm and dry.  Psychiatric: She has a normal mood and affect. Her behavior is normal. Judgment and thought content normal.  Nursing note and vitals reviewed.    ED Treatments / Results  Labs (all labs ordered are listed, but only abnormal results are displayed) Labs Reviewed  BASIC METABOLIC PANEL - Abnormal; Notable for the following:       Result Value   Chloride 100 (*)    Glucose, Bld 103 (*)    All other components within normal limits  CBC  TROPONIN I    EKG  EKG Interpretation None       Radiology Dg Chest 2 View  Result Date: 01/26/2017 CLINICAL DATA:  Heart palpitations today. EXAM: CHEST  2 VIEW COMPARISON:  None. FINDINGS: The lungs are clear. Heart size is normal. There is no pneumothorax or pleural fluid. No acute bony abnormality. Cholecystectomy clips noted. IMPRESSION: No acute disease. Electronically Signed   By: Drusilla Kanner M.D.   On: 01/26/2017 23:48    Procedures Procedures (including critical care time)  Medications Ordered in ED Medications - No data to display   Initial Impression / Assessment and Plan / ED Course  I have reviewed the triage vital signs and the nursing notes.  Pertinent labs & imaging results that were available during my care of the patient were reviewed by me and considered in my medical decision making (see chart for details).    Patient with palpitations since 10:35 last night.  She reports that she has never had symptoms last this long before.  She has had some intermittent episodes that occur every few months, but the symptoms always resolve with coughing or sleeping.  Discussed with Dr. Rhunette Croft, EKG looks similar to flutter, patient may be a candidate for cardioversion given onset of symptoms at 10:35.  Dr. Rhunette Croft to discuss with cardiology.  Cardiology recommends admission for multifocal atrial tachycardia.  Cardiology will consult.  Final Clinical  Impressions(s) / ED Diagnoses   Final diagnoses:  Palpitations    New Prescriptions New Prescriptions   No medications on file     Roxy Horseman, Cordelia Poche 01/27/17 [id number]    Derwood Kaplan, MD 01/30/17 1734

## 2017-01-27 NOTE — Progress Notes (Signed)
  Echocardiogram 2D Echocardiogram has been performed.  Janalyn HarderWest, Ashliegh Parekh R 01/27/2017, 2:29 PM

## 2017-02-21 ENCOUNTER — Ambulatory Visit (INDEPENDENT_AMBULATORY_CARE_PROVIDER_SITE_OTHER): Payer: BC Managed Care – PPO | Admitting: Physician Assistant

## 2017-02-21 VITALS — BP 122/74 | HR 84 | Ht 65.5 in | Wt 114.0 lb

## 2017-02-21 DIAGNOSIS — I471 Supraventricular tachycardia: Secondary | ICD-10-CM | POA: Diagnosis not present

## 2017-02-21 NOTE — Patient Instructions (Addendum)
Medication Instructions:   Your physician recommends that you continue on your current medications as directed. Please refer to the Current Medication list given to you today.   If you need a refill on your cardiac medications before your next appointment, please call your pharmacy.  Labwork: NONE ORDERED  TODAY    Testing/Procedures: .MRI SOMEONE WILL CONTACT YOU BACK FOR FURTHER STEPS   Follow-Up  CONTACT [facility] [phone] AS NEEDED FOR  ANY CARDIAC RELATED SYMPTOMS   OR AFTER A YEAR  FOR A  ANNUAL  FOLLOW UP       Any Other Special Instructions Will Be Listed Below (If Applicable).

## 2017-02-21 NOTE — Progress Notes (Signed)
Cardiology Office Note Date:  02/21/2017  Patient ID:  Heidi Aguilar L. Heidi, Aguilar 1960-06-21, MRN [id number] PCP:  Mila Palmer, MD  Cardiologist:  New at hospital, Dr. Graciela Husbands    Chief Complaint: post hospital f/u  History of Present Illness: Heidi Aguilar is a 56 y.o. female with history of of GERD, reports MVP, and at her most recenthispital stay, discharged 01/27/17, very breif episodes of ATach..  The patient was seen in the hospital by myself and Dr.Klein, she described:: 4-5 years of occ palpitations/skipped beat type feelings she has been told were PVCs (by her PMD) and of no concern, they were infrequent and not bothersome to her and nothing more was done.  She reports since her teens being told she had MVP and has had a few echos over the years, always only mentioning MVP without any other abnormalities, the last perhaps 10 years or so ago.  She has never seen a cardiologist, and have been done via her PMD service.  Last night she developed unusual palpitations that were brief but more of a fluttering sensation with pressure on the sides of her neck when they would occur.  No CP or SOB, no dizziness, near syncope or syncope.  They persisted, always brief only seconds but recurrently, these were making her feel anxious and after about 45 minutes decided to get checked out.   After discussion it was the patient preference to pursue a very conservative approach and was rx PRN lopressor.  An echo was ordered and she was planned for discharge.  Dr. Graciela Husbands pointed out her pectus deformity and arachnodactyly with need for screen particularly for Marfan's, suggested if Ao not well visualized to get CT or other modality and if Ao root dilated will need chronic BB.  She has not had any recurrent persistent palpitations.  She has had the quick skipped beat that she has had over the years attributed to PVCs only.  She has not had to take the lopressor.  No CP, back pain or SOB, no dizziness, near syncope  or syncope, feels well.    Past Medical History:  Diagnosis Date  . Breast calcification, left 06/17/2012  . Breast calcifications    left breast  . GERD (gastroesophageal reflux disease)   . 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 MVP (mitral valve prolapse)     Past Surgical History:  Procedure Laterality Date  . CATARACT EXTRACTION     lt eye  . CHOLECYSTECTOMY  1990  . EYE SURGERY     lt eye age 65 and 6 cong cataracts  . WISDOM TOOTH EXTRACTION  1992    Current Outpatient Prescriptions  Medication Sig Dispense Refill  . Cholecalciferol (VITAMIN D PO) Take by mouth.    . metoprolol tartrate (LOPRESSOR) 25 MG tablet Take 25 mg by mouth daily as needed (PALPITATIONS).    . Multiple Vitamin (MULTIVITAMIN) capsule Take 1 capsule by mouth daily.    . Omega-3 1000 MG CAPS Take 1,000 mg by mouth daily.    . Probiotic Product (PROBIOTIC PO) Take 1 capsule by mouth daily.     No current facility-administered medications for this visit.     Allergies:   Acetaminophen; Aspirin; Percocet [oxycodone-acetaminophen]; and Vicodin [hydrocodone-acetaminophen]   Social History:  The patient  reports that she has never smoked. She has never used smokeless tobacco. She reports that she does not drink alcohol or use drugs.   Family History:  The patient's family history includes Cancer in her  maternal grandmother, maternal uncle, and paternal grandfather; Heart disease in her father.  ROS:  Please see the history of present illness.    All other systems are reviewed and otherwise negative.   PHYSICAL EXAM:  VS:  BP 122/74   Pulse 84   Ht 5' 5.5" (1.664 m)   Wt 114 lb (51.7 kg)   BMI 18.68 kg/m  BMI: Body mass index is 18.68 kg/m. Well nourished, well developed, very thin body habitus, in no acute distress  HEENT: normocephalic, atraumatic  Neck: no JVD, carotid bruits or masses Cardiac:  RRR; no significant murmurs, no rubs, or gallops Lungs:  CTA b/l, no wheezing, rhonchi  or rales  Abd: soft, nontender MS: Pectus excavatum, arachnodactyly, no atrophy Ext:  no edema  Skin: warm and dry, no rash Neuro:  No gross deficits appreciated Psych: euthymic mood, full affect   EKG:   Not done today, hospital EKGs reviewed  01/27/17: TTE Study Conclusions - Left ventricle: The cavity size was normal. Wall thickness was   normal. Systolic function was normal. The estimated ejection   fraction was in the range of 60% to 65%. 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 Calcified annulus.   Recent Labs: 01/26/2017: BUN 17; Creatinine, Ser 0.87; Hemoglobin 13.2; Platelets 189; Potassium 3.7; Sodium 136 01/27/2017: Magnesium 2.0  No results found for requested labs within last 8760 hours.   CrCl cannot be calculated (Patient's most recent lab result is older than the maximum 21 days allowed.).   Wt Readings from Last 3 Encounters:  02/21/17 114 lb (51.7 kg)  01/27/17 111 lb 15.9 oz (50.8 kg)  06/17/12 109 lb (49.4 kg)     Other studies reviewed: Additional studies/records reviewed today include: summarized above  ASSESSMENT AND PLAN:  1. ATach     No recurrent symptoms  2.   Screening AO root/ascending AO       AO not specifically discussed in echo report       discusse with the patient echo findings, recommend further imaging though she was reluctant.         I discussed with Dr. Mayford Knife (echo reader today), if her echo could be re-evaluated specifically for AO  She reviewed the report/measurements and does not think this will be adequate to evaulate for what our concerns are      I re-visited this with the patient, will proceed with cardiac MRI/MRA  Disposition: F/u MR findings, otherwise 1 year/PRN.  Current medicines are reviewed at length with the patient today.  The patient did not have any concerns regarding medicines.  Norma Fredrickson, PA-C 02/21/2017 11:43 AM     [facility] [address] [place] [address] [phone] (office)  [phone] (fax)

## 2017-02-24 ENCOUNTER — Encounter: Payer: Self-pay | Admitting: Physician Assistant

## 2017-03-05 ENCOUNTER — Ambulatory Visit (HOSPITAL_COMMUNITY)
Admission: RE | Admit: 2017-03-05 | Discharge: 2017-03-05 | Disposition: A | Discharge status: Home / Self Care | Payer: BC Managed Care – PPO | Source: Ambulatory Visit | Attending: Physician Assistant | Admitting: Physician Assistant

## 2017-03-05 DIAGNOSIS — I340 Nonrheumatic mitral (valve) insufficiency: Secondary | ICD-10-CM | POA: Insufficient documentation

## 2017-03-05 DIAGNOSIS — I471 Supraventricular tachycardia: Secondary | ICD-10-CM | POA: Diagnosis not present

## 2017-03-05 LAB — CREATININE, SERUM
Creatinine, Ser: 0.89 mg/dL (ref 0.44–1.00)
GFR calc non Af Amer: >60 mL/min (ref >60)

## 2017-03-05 MED ORDER — HEPARIN SOD (PORK) LOCK FLUSH 100 UNIT/ML IV SOLN: 500.0000 [IU] | INTRAVENOUS | Status: DC | PRN

## 2017-03-05 MED ORDER — GADOBENATE DIMEGLUMINE 529 MG/ML IV SOLN
20.0000 mL | Freq: Once | INTRAVENOUS | Status: AC | PRN
  Administered 2017-03-05: 20 mL via INTRAVENOUS

## 2017-03-06 DIAGNOSIS — I472 Ventricular tachycardia: Secondary | ICD-10-CM | POA: Diagnosis not present

## 2018-03-03 ENCOUNTER — Encounter: Payer: Self-pay | Admitting: Physician Assistant

## 2018-03-22 NOTE — Progress Notes (Signed)
Cardiology Office Note Date:  03/23/2018  Patient ID:  Heidi Aguilar, Heidi Aguilar October 24, 1960, MRN [id number] PCP:  Mila Palmer, MD  Cardiologist:  New at hospital, Dr. Graciela Husbands    Chief Complaint: annual visit  History of Present Illness: Heidi Aguilar is a 57 y.o. female with history of of GERD, reports MVP, and at her most recenthispital stay, discharged 01/27/17, very breif episodes of ATach..  The patient was seen in the hospital by myself and Dr.Klein, she described:: 4-5 years of occ palpitations/skipped beat type feelings she has been told were PVCs (by her PMD) and of no concern, they were infrequent and not bothersome to her and nothing more was done.  She reports since her teens being told she had MVP and has had a few echos over the years, always only mentioning MVP without any other abnormalities, the last perhaps 10 years or so ago.  She has never seen a cardiologist, and have been done via her PMD service.  Last night she developed unusual palpitations that were brief but more of a fluttering sensation with pressure on the sides of her neck when they would occur.  No CP or SOB, no dizziness, near syncope or syncope.  They persisted, always brief only seconds but recurrently, these were making her feel anxious and after about 45 minutes decided to get checked out.   After discussion it was the patient preference to pursue a very conservative approach and was rx PRN lopressor.  An echo was ordered and she was planned for discharge.  Dr. Graciela Husbands pointed out her pectus deformity and arachnodactyly with need for screen particularly for Marfan's, suggested if Ao not well visualized to get CT or other modality and if Ao root dilated will need chronic BB.  I saw her Oct 2018, she had not had any recurrent persistent palpitations.  She had had the quick skipped beat that she has had over the years attributed to PVCs only.  She had not had to take the lopressor.  No CP, back pain or SOB, no  dizziness, near syncope or syncope, feels well. Her echo was reviewed, and reviewed with DOD who felt it inadequate to assess Aoritc root, and cardiac MR was done noted below with normal root.  She has continued to do quite well.  She has had 2 episodes of tachycardia, both triggered by stressful events and self terminated with relaxation efforts.  She has not needed to use the lopressor.  No CP, SOB or exertional intolerances, no near syncope or syncope.  She is following her labs with her PMD.   Past Medical History:  Diagnosis Date  . Breast calcification, left 06/17/2012  . Breast calcifications    left breast  . GERD (gastroesophageal reflux disease)   . 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 MVP (mitral valve prolapse)     Past Surgical History:  Procedure Laterality Date  . CATARACT EXTRACTION     lt eye  . CHOLECYSTECTOMY  1990  . EYE SURGERY     lt eye age 40 and 6 cong cataracts  . WISDOM TOOTH EXTRACTION  1992    Current Outpatient Medications  Medication Sig Dispense Refill  . Cholecalciferol (VITAMIN D PO) Take by mouth.    . metoprolol tartrate (LOPRESSOR) 25 MG tablet Take 25 mg by mouth daily as needed (PALPITATIONS).    . Multiple Vitamin (MULTIVITAMIN) capsule Take 1 capsule by mouth daily.    . Omega-3 1000 MG CAPS Take 1,000 mg  by mouth daily.    . Probiotic Product (PROBIOTIC PO) Take 1 capsule by mouth daily.     No current facility-administered medications for this visit.     Allergies:   Acetaminophen; Aspirin; Percocet [oxycodone-acetaminophen]; and Vicodin [hydrocodone-acetaminophen]   Social History:  The patient  reports that she has never smoked. She has never used smokeless tobacco. She reports that she does not drink alcohol or use drugs.   Family History:  The patient's family history includes Cancer in her maternal grandmother, maternal uncle, and paternal grandfather; Heart disease in her father.  ROS:  Please see the history of  present illness.    All other systems are reviewed and otherwise negative.   PHYSICAL EXAM:  VS:  BP 120/68   Pulse 70   Ht 5' 5.5" (1.664 m)   Wt 107 lb (48.5 kg)   BMI 17.53 kg/m  BMI: Body mass index is 17.53 kg/m. Well nourished, well developed, very thin body habitus, in no acute distress  HEENT: normocephalic, atraumatic  Neck: no JVD, carotid bruits or masses Cardiac:  RRR; no significant murmurs, no rubs, or gallops Lungs:  CTA b/l, no wheezing, rhonchi or rales  Abd: soft, nontender MS: Pectus excavatum, arachnodactyly, no atrophy Ext:  no edema  Skin: warm and dry, no rash Neuro:  No gross deficits appreciated Psych: euthymic mood, full affect   EKG:   Done today and reviewed by myself: SR, 70bpm, intervals are wnl  03/05/17: Cardiac MRI IMPRESSION: 1. Normal left ventricular size, thickness and systolic function (LVEF = 60%). There are no regional wall motion abnormalities.  There is no late gadolinium enhancement in the left ventricular myocardium.  2. Normal right ventricular size, thickness and systolic function (LVEF = 53%). There are no regional wall motion abnormalities.  3. Mitral valve leaflets appear myxomatous thickened with mild anterior leaflet prolapse but only mild mitral regurgitation. Trivial tricuspid regurgitation.  4. Mildly dilated left atrium.  5. Normal size of the aortic root, ascending aorta.  There is no evidence for infiltrative or inflammatory cardiomyopathy or arrhythmogenic right ventricular dysplasia.  01/27/17: TTE Study Conclusions - Left ventricle: The cavity size was normal. Wall thickness was   normal. Systolic function was normal. The estimated ejection   fraction was in the range of 60% to 65%. 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 Calcified annulus.   Recent Labs: No results found for requested labs within last 8760 hours.  No results found for requested labs within last  8760 hours.   CrCl cannot be calculated (Patient's most recent lab result is older than the maximum 21 days allowed.).   Wt Readings from Last 3 Encounters:  03/23/18 107 lb (48.5 kg)  02/21/17 114 lb (51.7 kg)  01/27/17 111 lb 15.9 oz (50.8 kg)     Other studies reviewed: Additional studies/records reviewed today include: summarized above  ASSESSMENT AND PLAN:  1. ATach     Stress is atrigger for her     Self terminated, 2 episodes since visit last year     Discussed stress management strategies.   Disposition: 1 year, sooner if needed   Current medicines are reviewed at length with the patient today.  The patient did not have any concerns regarding medicines.  Norma Fredrickson, PA-C 03/23/2018 12:10 PM     [facility] [address] [place] [address] [phone] (office)  [phone] (fax)

## 2018-03-23 ENCOUNTER — Ambulatory Visit: Payer: BC Managed Care – PPO | Admitting: Physician Assistant

## 2018-03-23 VITALS — BP 120/68 | HR 70 | Ht 65.5 in | Wt 107.0 lb

## 2018-03-23 DIAGNOSIS — I471 Supraventricular tachycardia: Secondary | ICD-10-CM

## 2018-03-23 MED ORDER — METOPROLOL TARTRATE 25 MG PO TABS: 25.0000 mg | ORAL_TABLET | Freq: Every day | ORAL | 1 refills | Status: DC | PRN

## 2018-03-23 NOTE — Patient Instructions (Signed)
Medication Instructions:   Your physician recommends that you continue on your current medications as directed. Please refer to the Current Medication list given to you today.   If you need a refill on your cardiac medications before your next appointment, please call your pharmacy.  Labwork: NONE ORDERED  TODAY    Testing/Procedures: NONE ORDERED  TODAY    Follow-Up:  Your physician wants you to follow-up in: ONE YEAR WITH  URSUY  You will receive a reminder letter in the mail two months in advance. If you don't receive a letter, please call our office to schedule the follow-up appointment.     Any Other Special Instructions Will Be Listed Below (If Applicable).                                                                                                                                                 ' 

## 2018-10-08 ENCOUNTER — Telehealth: Payer: Self-pay

## 2018-10-08 DIAGNOSIS — Z20822 Contact with and (suspected) exposure to covid-19: Secondary | ICD-10-CM

## 2018-10-08 NOTE — Telephone Encounter (Signed)
Dewayne Hatch, Clinical Supervisor with [facility] called and says Dr. Mila Palmer is requesting a COVID test on the patient. I advised I will call the patient to schedule. I called the patient and advised of the test, appointment scheduled for tomorrow at 1000 at [facility], advised location and to wear a mask.  Send results to Dr. Paulino Rily: Office: [phone] Fax: [phone]

## 2018-10-09 ENCOUNTER — Other Ambulatory Visit: Payer: BC Managed Care – PPO

## 2018-10-09 ENCOUNTER — Telehealth: Payer: Self-pay

## 2018-10-09 DIAGNOSIS — Z20822 Contact with and (suspected) exposure to covid-19: Secondary | ICD-10-CM

## 2018-10-09 NOTE — Telephone Encounter (Signed)
Dr. Paulino Rily ordered COVID 19 testing. Re-order due to testing site request.

## 2018-10-12 LAB — NOVEL CORONAVIRUS, NAA: SARS-CoV-2, NAA: NOT DETECTED

## 2019-03-25 NOTE — Progress Notes (Deleted)
Cardiology Office Note Date:  03/25/2019  Patient ID:  Heidi Aguilar, Heidi Aguilar 11/13/1960, MRN [id number] PCP:  Mila Palmer, MD  Cardiologist:  New at hospital, Dr. Graciela Husbands    Chief Complaint:  *** annual visit  History of Present Illness: Heidi Aguilar is a 58 y.o. female with history of of GERD, reports MVP, and at her most recent hospital stay, discharged 01/27/17, very breif episodes of ATach..  The patient was seen in the hospital by myself and Dr.Klein, she described:: 4-5 years of occ palpitations/skipped beat type feelings she has been told were PVCs (by her PMD) and of no concern, they were infrequent and not bothersome to her and nothing more was done.  She reports since her teens being told she had MVP and has had a few echos over the years, always only mentioning MVP without any other abnormalities, the last perhaps 10 years or so ago.  She has never seen a cardiologist, and have been done via her PMD service.  Last night she developed unusual palpitations that were brief but more of a fluttering sensation with pressure on the sides of her neck when they would occur.  No CP or SOB, no dizziness, near syncope or syncope.  They persisted, always brief only seconds but recurrently, these were making her feel anxious and after about 45 minutes decided to get checked out.   After discussion it was the patient preference to pursue a very conservative approach and was rx PRN lopressor.  An echo was ordered and she was planned for discharge.  Dr. Graciela Husbands pointed out her pectus deformity and arachnodactyly with need for screen particularly for Marfan's, suggested if Ao not well visualized to get CT or other modality and if Ao root dilated will need chronic BB.  I saw her Oct 2018, she had not had any recurrent persistent palpitations.  She had had the quick skipped beat that she has had over the years attributed to PVCs only.  She had not had to take the lopressor.  No CP, back pain or SOB, no  dizziness, near syncope or syncope, feels well. Her echo was reviewed, and reviewed with DOD who felt it inadequate to assess Aoritc root, and cardiac MR was done noted below with normal root.  I saw her again Nov 2019, she continued to do quite well.  She had 2 episodes of tachycardia, both triggered by stressful events and self terminated with relaxation efforts.  She had not needed to use the lopressor.  No CP, SOB or exertional intolerances, no near syncope or syncope.  She is following her labs with her PMD.  Discussed stress management strategies.  No changes were made.  *** symptoms *** prn BB *** labs, lipids... *** cardiac health   Past Medical History:  Diagnosis Date  . Breast calcification, left 06/17/2012  . Breast calcifications    left breast  . GERD (gastroesophageal reflux disease)   . 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 MVP (mitral valve prolapse)     Past Surgical History:  Procedure Laterality Date  . CATARACT EXTRACTION     lt eye  . CHOLECYSTECTOMY  1990  . EYE SURGERY     lt eye age 57 and 6 cong cataracts  . WISDOM TOOTH EXTRACTION  1992    Current Outpatient Medications  Medication Sig Dispense Refill  . Cholecalciferol (VITAMIN D PO) Take by mouth.    . metoprolol tartrate (LOPRESSOR) 25 MG tablet Take 1 tablet (25 mg total) by  mouth daily as needed (PALPITATIONS). 30 tablet 1  . Multiple Vitamin (MULTIVITAMIN) capsule Take 1 capsule by mouth daily.    . Omega-3 1000 MG CAPS Take 1,000 mg by mouth daily.    . Probiotic Product (PROBIOTIC PO) Take 1 capsule by mouth daily.     No current facility-administered medications for this visit.     Allergies:   Acetaminophen, Aspirin, Percocet [oxycodone-acetaminophen], and Vicodin [hydrocodone-acetaminophen]   Social History:  The patient  reports that she has never smoked. She has never used smokeless tobacco. She reports that she does not drink alcohol or use drugs.   Family History:  The  patient's family history includes Cancer in her maternal grandmother, maternal uncle, and paternal grandfather; Heart disease in her father.  ROS:  Please see the history of present illness.    All other systems are reviewed and otherwise negative.   PHYSICAL EXAM:  VS:  There were no vitals taken for this visit. BMI: There is no height or weight on file to calculate BMI. Well nourished, well developed, very thin body habitus, in no acute distress  HEENT: normocephalic, atraumatic  Neck: no JVD, carotid bruits or masses Cardiac:  RRR; no significant murmurs, no rubs, or gallops Lungs:  CTA b/l, no wheezing, rhonchi or rales  Abd: soft, nontender MS: Pectus excavatum, arachnodactyly, no atrophy Ext:  no edema  Skin: warm and dry, no rash Neuro:  No gross deficits appreciated Psych: euthymic mood, full affect   EKG:   Done today and reviewed by myself: SR, 70bpm, intervals are wnl  03/05/17: Cardiac MRI IMPRESSION: 1. Normal left ventricular size, thickness and systolic function (LVEF = 60%). There are no regional wall motion abnormalities.  There is no late gadolinium enhancement in the left ventricular myocardium.  2. Normal right ventricular size, thickness and systolic function (LVEF = 53%). There are no regional wall motion abnormalities.  3. Mitral valve leaflets appear myxomatous thickened with mild anterior leaflet prolapse but only mild mitral regurgitation. Trivial tricuspid regurgitation.  4. Mildly dilated left atrium.  5. Normal size of the aortic root, ascending aorta.  There is no evidence for infiltrative or inflammatory cardiomyopathy or arrhythmogenic right ventricular dysplasia.  01/27/17: TTE Study Conclusions - Left ventricle: The cavity size was normal. Wall thickness was   normal. Systolic function was normal. The estimated ejection   fraction was in the range of 60% to 65%. - Mitral valve: MV leaflets are thickened with myxomatous    appearance. Calcified annulus.   Recent Labs: No results found for requested labs within last 8760 hours.  No results found for requested labs within last 8760 hours.   CrCl cannot be calculated (Patient's most recent lab result is older than the maximum 21 days allowed.).   Wt Readings from Last 3 Encounters:  03/23/18 107 lb (48.5 kg)  02/21/17 114 lb (51.7 kg)  01/27/17 111 lb 15.9 oz (50.8 kg)     Other studies reviewed: Additional studies/records reviewed today include: summarized above  ASSESSMENT AND PLAN:  1. ATach     *** Stress is atrigger for her     *** Discussed stress management strategies.   Disposition: 1 year, sooner if needed   Current medicines are reviewed at length with the patient today.  The patient did not have any concerns regarding medicines.  Venetia Night, PA-C 03/25/2019 6:48 AM     [place] [address] [phone] (office)  [phone] (  fax)

## 2019-03-26 ENCOUNTER — Ambulatory Visit: Payer: BC Managed Care – PPO | Admitting: Physician Assistant

## 2019-04-08 NOTE — Progress Notes (Signed)
Cardiology Office Note Date:  04/08/2019  Patient ID:  Heidi Aguilar, Heidi Aguilar [birth year], MRN [id number] PCP:  Jonathon Jordan, MD  Cardiologist:  New at hospital, Dr. Caryl Comes    Chief Complaint: annual visit  History of Present Illness: Heidi Aguilar is a 58 y.o. female with history of of GERD, reports MVP, very breif episodes of ATach..  The patient was seen in the hospital 2018  by myself and Dr.Klein, she described:: 4-5 years of occ palpitations/skipped beat type feelings she has been told were PVCs (by her PMD) and of no concern, they were infrequent and not bothersome to her and nothing more was done.  She reports since her teens being told she had MVP and has had a few echos over the years, always only mentioning MVP without any other abnormalities, the last perhaps 10 years or so ago.  She has never seen a cardiologist, and have been done via her PMD service.  Last night she developed unusual palpitations that were brief but more of a fluttering sensation with pressure on the sides of her neck when they would occur.  No CP or SOB, no dizziness, near syncope or syncope.  They persisted, always brief only seconds but recurrently, these were making her feel anxious and after about 45 minutes decided to get checked out.   After discussion it was the patient preference to pursue a very conservative approach and was rx PRN lopressor.  An echo was ordered and she was planned for discharge.  Dr. Caryl Comes pointed out her pectus deformity and arachnodactyly with need for screen particularly for Marfan's, suggested if Ao not well visualized to get CT or other modality and if Ao root dilated will need chronic BB.  I saw her Oct 2018, she had not had any recurrent persistent palpitations.  She had had the quick skipped beat that she has had over the years attributed to PVCs only.  She had not had to take the lopressor.  No CP, back pain or SOB, no dizziness, near syncope or syncope, feels well. Her echo was  reviewed, and reviewed with DOD who felt it inadequate to assess Aoritc root, and cardiac MR was done noted below with normal root.  At our visit Nov 2019, she continued to do quite well.  She has had 2 episodes of tachycardia, both triggered by stressful events and self terminated with relaxation efforts.  She had not needed to use the lopressor.  No CP, SOB or exertional intolerances, no near syncope or syncope.  She was following her labs with her PMD.  No changes were made, discussed stress management   She is again doing well   She reports only 3-4 episodes of her tachycardia, all self resolved or by stretching/relaxing.  She has noted that stress is aa trigger, and when she gets off her usual eating/food pattern as well.  She remeins very active physically , no exertional intolerances, no dizzy spells, near syncope or syncope, no CP, SOB, DOE.    Past Medical History:  Diagnosis Date  . Breast calcification, left 06/17/2012  . Breast calcifications    left breast  . GERD (gastroesophageal reflux disease)   . 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 MVP (mitral valve prolapse)     Past Surgical History:  Procedure Laterality Date  . CATARACT EXTRACTION     lt eye  . CHOLECYSTECTOMY  1990  . EYE SURGERY     lt eye age 4 and 5 cong cataracts  .  WISDOM TOOTH EXTRACTION  1992    Current Outpatient Medications  Medication Sig Dispense Refill  . Cholecalciferol (VITAMIN D PO) Take by mouth.    . metoprolol tartrate (LOPRESSOR) 25 MG tablet Take 1 tablet (25 mg total) by mouth daily as needed (PALPITATIONS). 30 tablet 1  . Multiple Vitamin (MULTIVITAMIN) capsule Take 1 capsule by mouth daily.    . Omega-3 1000 MG CAPS Take 1,000 mg by mouth daily.    . Probiotic Product (PROBIOTIC PO) Take 1 capsule by mouth daily.     No current facility-administered medications for this visit.     Allergies:   Acetaminophen, Aspirin, Percocet [oxycodone-acetaminophen], and Vicodin  [hydrocodone-acetaminophen]   Social History:  The patient  reports that she has never smoked. She has never used smokeless tobacco. She reports that she does not drink alcohol or use drugs.   Family History:  The patient's family history includes Cancer in her maternal grandmother, maternal uncle, and paternal grandfather; Heart disease in her father.  ROS:  Please see the history of present illness.    All other systems are reviewed and otherwise negative.   PHYSICAL EXAM:  VS:  There were no vitals taken for this visit. BMI: There is no height or weight on file to calculate BMI. Well nourished, well developed, very thin body habitus, in no acute distress  HEENT: normocephalic, atraumatic  Neck: no JVD, carotid bruits or masses Cardiac:   RRR; no significant murmurs, no rubs, or gallops Lungs:   CTA b/l, no wheezing, rhonchi or rales  Abd: soft, nontender MS: Pectus excavatum, arachnodactyly, no atrophy Ext:  no edema  Skin: warm and dry, no rash Neuro:  No gross deficits appreciated Psych: euthymic mood, full affect   EKG:   Done today and reviewed by myself: SR 74, , unchanged from prior  03/05/17: Cardiac MRI IMPRESSION: 1. Normal left ventricular size, thickness and systolic function (LVEF = 60%). There are no regional wall motion abnormalities.  There is no late gadolinium enhancement in the left ventricular myocardium.  2. Normal right ventricular size, thickness and systolic function (LVEF = 53%). There are no regional wall motion abnormalities.  3. Mitral valve leaflets appear myxomatous thickened with mild anterior leaflet prolapse but only mild mitral regurgitation. Trivial tricuspid regurgitation.  4. Mildly dilated left atrium.  5. Normal size of the aortic root, ascending aorta.  There is no evidence for infiltrative or inflammatory cardiomyopathy or arrhythmogenic right ventricular dysplasia.  01/27/17: TTE Study Conclusions - Left ventricle: The  cavity size was normal. Wall thickness was   normal. Systolic function was normal. The estimated ejection   fraction was in the range of 60% to 65%. 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 Calcified annulus.   Recent Labs: No results found for requested labs within last 8760 hours.  No results found for requested labs within last 8760 hours.   CrCl cannot be calculated (Patient's most recent lab result is older than the maximum 21 days allowed.).   Wt Readings from Last 3 Encounters:  03/23/18 107 lb (48.5 kg)  02/21/17 114 lb (51.7 kg)  01/27/17 111 lb 15.9 oz (50.8 kg)     Other studies reviewed: Additional studies/records reviewed today include: summarized above  ASSESSMENT AND PLAN:  1. ATach     Stress and some foods are atriggers for her     She has never used her PRN lopressor     Discussed stress management strategies.  Disposition: we weill continue to see her annually and PRN.   Current medicines are reviewed at length with the patient today.  The patient did not have any concerns regarding medicines.  Norma Fredrickson, PA-C 04/08/2019 6:09 AM     [facility] [address] [place] [address] [phone] (office)  [phone] (fax)

## 2019-04-09 ENCOUNTER — Other Ambulatory Visit: Payer: Self-pay

## 2019-04-09 ENCOUNTER — Ambulatory Visit: Payer: BC Managed Care – PPO | Admitting: Physician Assistant

## 2019-04-09 VITALS — BP 138/76 | HR 74 | Ht 65.5 in | Wt 110.0 lb

## 2019-04-09 DIAGNOSIS — I471 Supraventricular tachycardia: Secondary | ICD-10-CM

## 2019-04-09 MED ORDER — METOPROLOL TARTRATE 25 MG PO TABS: 25.0000 mg | ORAL_TABLET | Freq: Every day | ORAL | 1 refills | Status: DC | PRN

## 2019-04-09 NOTE — Patient Instructions (Signed)
Medication Instructions:  Your physician recommends that you continue on your current medications as directed. Please refer to the Current Medication list given to you today.  *If you need a refill on your cardiac medications before your next appointment, please call your pharmacy*  Lab Work: NONE ORDERED  TODAY   If you have labs (blood work) drawn today and your tests are completely normal, you will receive your results only by: . MyChart Message (if you have MyChart) OR . A paper copy in the mail If you have any lab test that is abnormal or we need to change your treatment, we will call you to review the results.  Testing/Procedures: NONE ORDERED  TODAY   Follow-Up: At [facility], you and your health needs are our priority.  As part of our continuing mission to provide you with exceptional heart care, we have created designated Provider Care Teams.  These Care Teams include your primary Cardiologist (physician) and Advanced Practice Providers (APPs -  Physician Assistants and Nurse Practitioners) who all work together to provide you with the care you need, when you need it.  Your next appointment:   1 year(s)  The format for your next appointment:   In Person  Provider:   You may see Dr. Klein  or one of the following Advanced Practice Providers on your designated Care Team:    Amber Seiler, NP  Renee Ursuy, PA-C  Michael "Andy" Tillery, PA-C   Other Instructions  

## 2019-06-09 IMAGING — CR DG CHEST 2V
2 series · 2 of 2 positions shown · non-contrast
Comparison: None.

CLINICAL DATA: Heart palpitations today.

EXAM:
CHEST  2 VIEW

[chest pa]
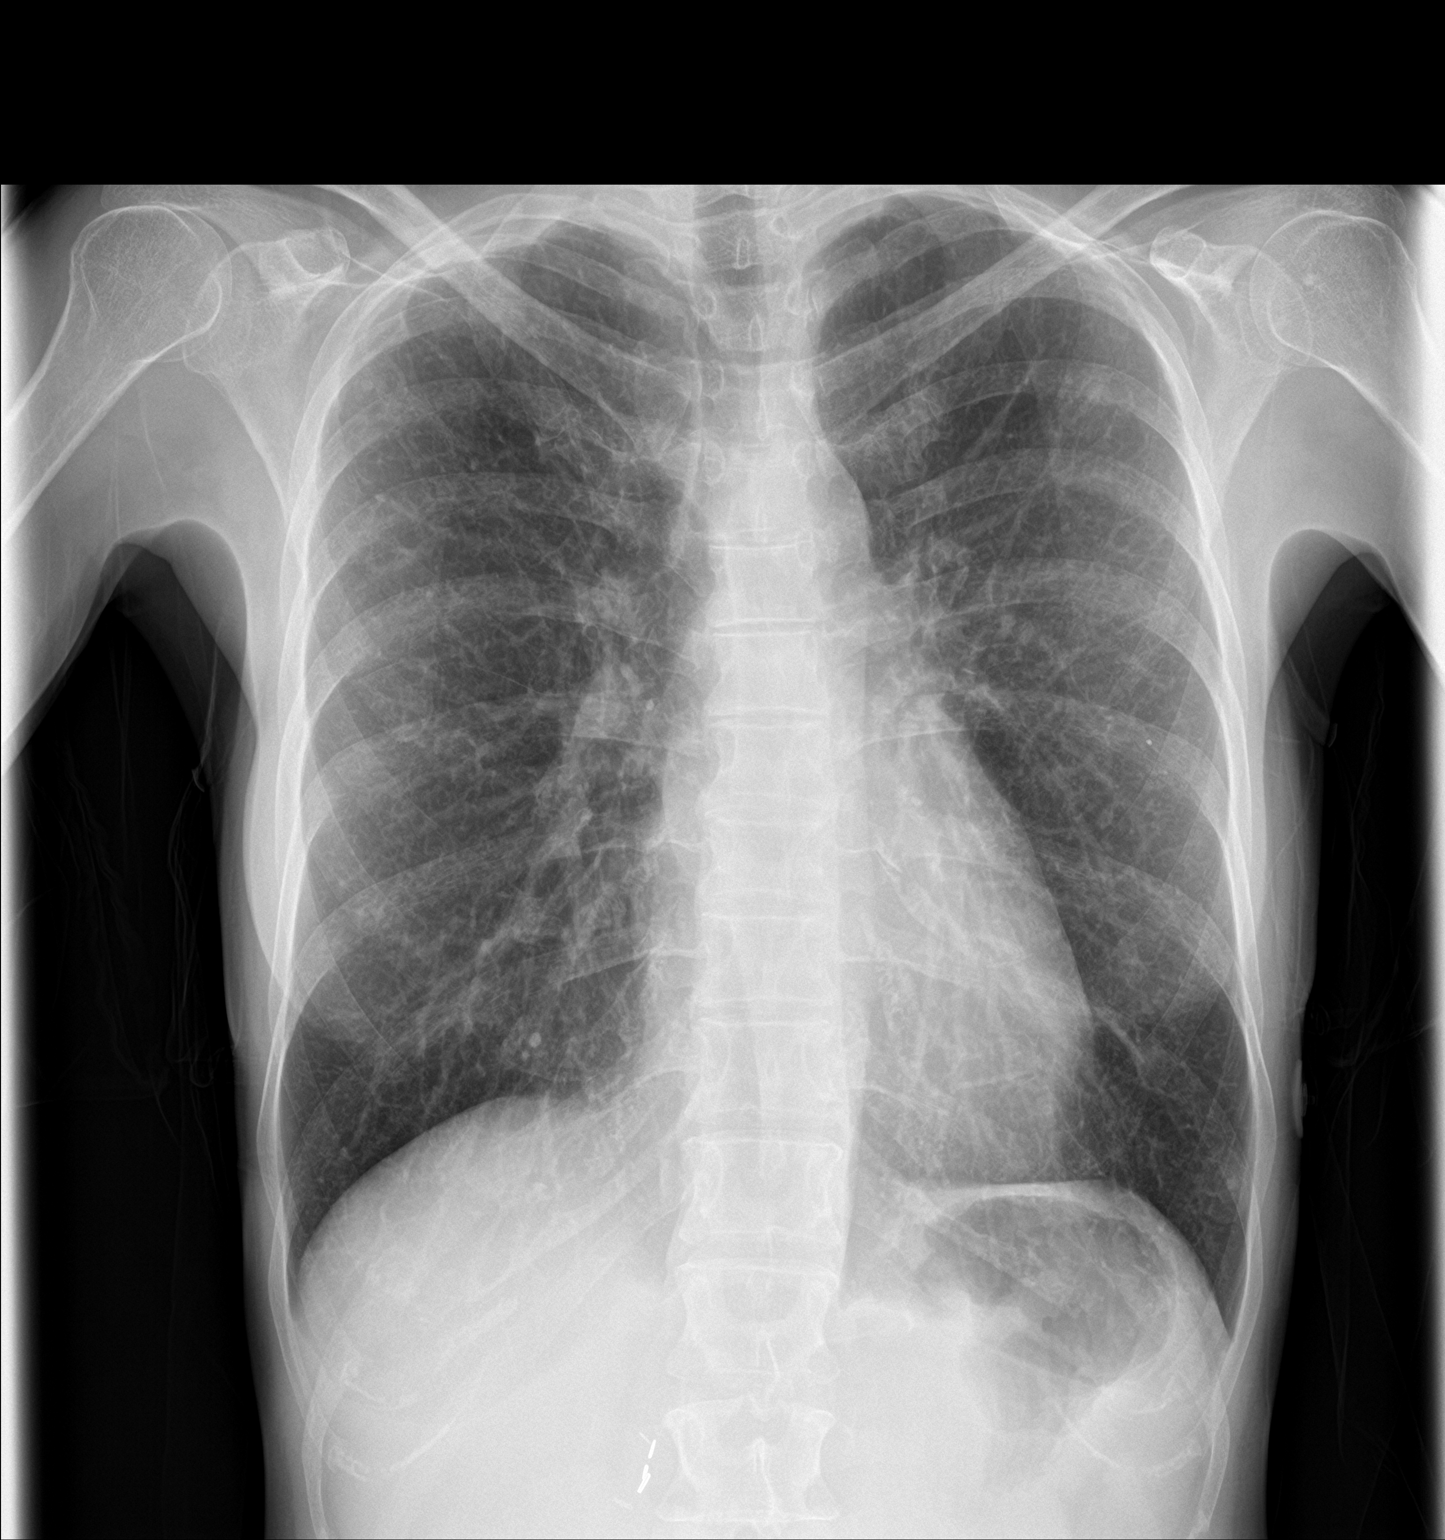

[chest lat]
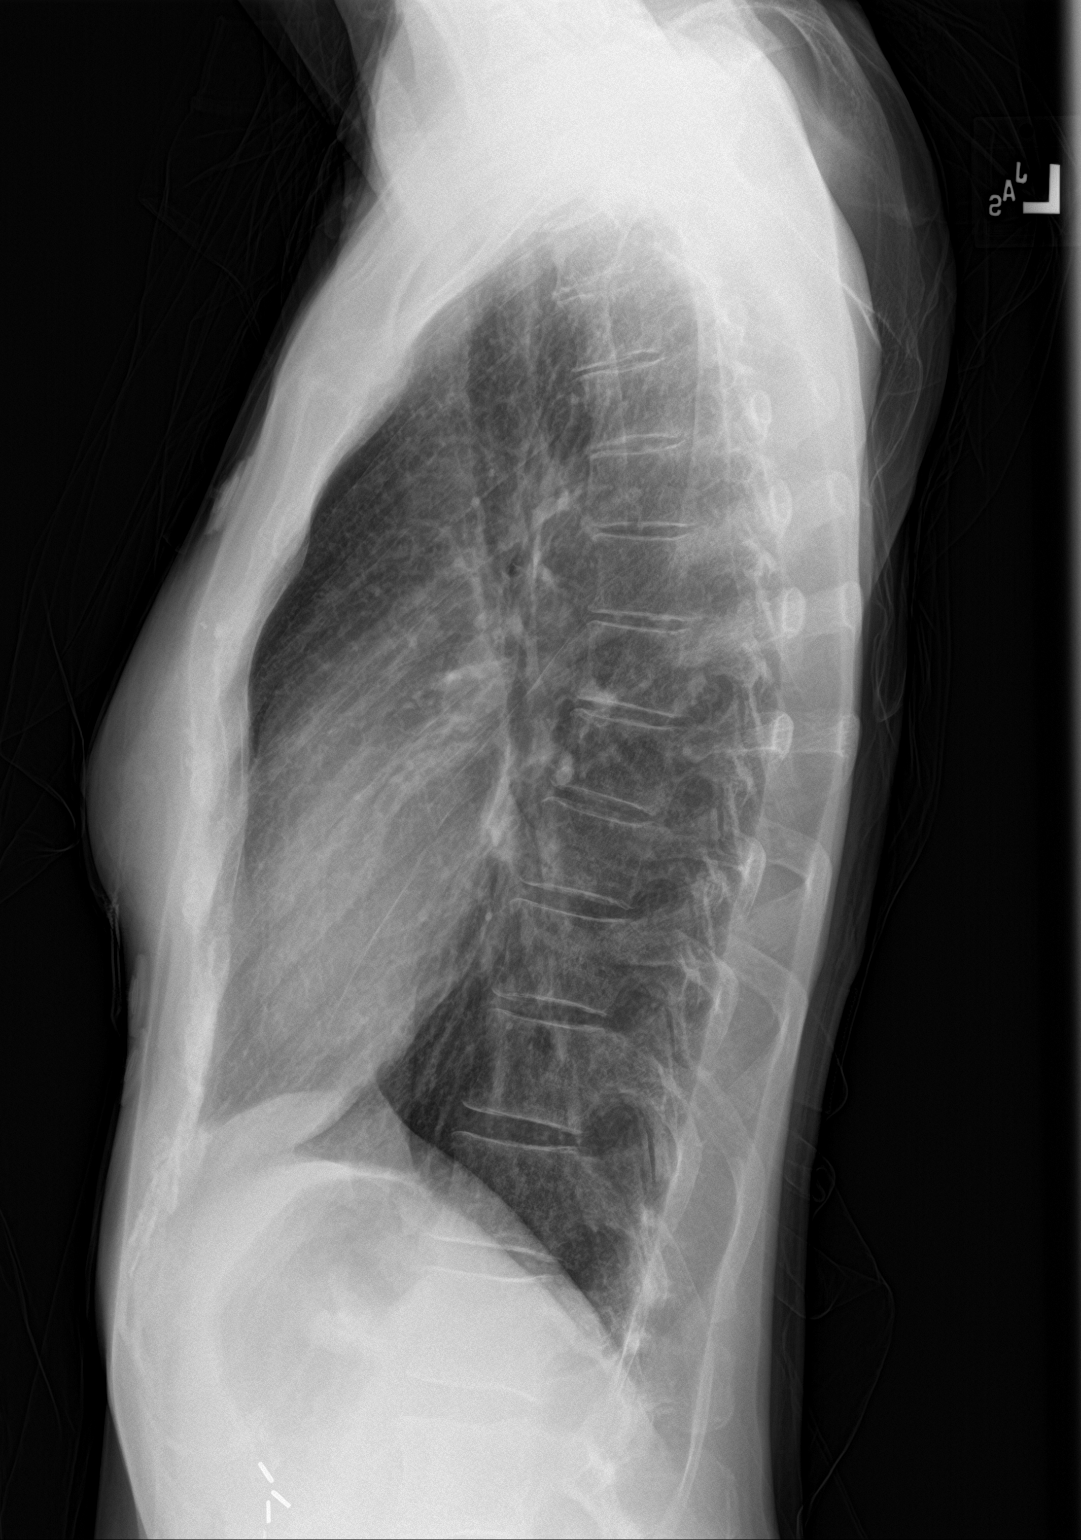

[2 of 2 positions shown; findings below may reference images not displayed]

FINDINGS: The lungs are clear. Heart size is normal. There is no pneumothorax
or pleural fluid. No acute bony abnormality. Cholecystectomy clips
noted.
IMPRESSION: No acute disease.

## 2020-11-12 NOTE — Progress Notes (Signed)
Cardiology Office Note Date:  11/14/2020  Patient ID:  Heidi Aguilar 1961-01-11, MRN [id number] PCP:  Mila Palmer, MD  Cardiologist:  New at hospital, Dr. Graciela Husbands    Chief Complaint:  annual visit  History of Present Illness: Heidi Aguilar is a 60 y.o. female with history of of GERD, reports MVP, very breif episodes of ATach..  The patient was seen in the hospital 2018  by myself and Dr.Klein, she described:: 4-5 years of occ palpitations/skipped beat type feelings she has been told were PVCs (by her PMD) and of no concern, they were infrequent and not bothersome to her and nothing more was done.  She reports since her teens being told she had MVP and has had a few echos over the years, always only mentioning MVP without any other abnormalities, the last perhaps 10 years or so ago.  She has never seen a cardiologist, and have been done via her PMD service.  Last night she developed unusual palpitations that were brief but more of a fluttering sensation with pressure on the sides of her neck when they would occur.  No CP or SOB, no dizziness, near syncope or syncope.  They persisted, always brief only seconds but recurrently, these were making her feel anxious and after about 45 minutes decided to get checked out.   After discussion it was the patient preference to pursue a very conservative approach and was rx PRN lopressor.  An echo was ordered and she was planned for discharge.  Dr. Graciela Husbands pointed out her pectus deformity and arachnodactyly with need for screen particularly for Marfan's, suggested if Ao not well visualized to get CT or other modality and if Ao root dilated will need chronic BB.  I saw her Oct 2018, she had not had any recurrent persistent palpitations.  She had had the quick skipped beat that she has had over the years attributed to PVCs only.  She had not had to take the lopressor.  No CP, back pain or SOB, no dizziness, near syncope or syncope, feels well. Her echo was  reviewed, and reviewed with DOD who felt it inadequate to assess Aoritc root, and cardiac MR was done noted below with normal root.  At our visit Nov 2019, she continued to do quite well.  She has had 2 episodes of tachycardia, both triggered by stressful events and self terminated with relaxation efforts.  She had not needed to use the lopressor.  No CP, SOB or exertional intolerances, no near syncope or syncope.  She was following her labs with her PMD.  No changes were made, discussed stress management  I saw her 04/09/2019 She is again doing well   She reports only 3-4 episodes of her tachycardia, all self resolved or by stretching/relaxing.  She has noted that stress is aa trigger, and when she gets off her usual eating/food pattern as well.  She remeins very active physically , no exertional intolerances, no dizzy spells, near syncope or syncope, no CP, SOB, DOE. Discussed stress management strategies, no changes were made, planned for annual and/or PRN visit  TODAY She continues to do very well The last time she had any tachycardia was Aug 2020. She has never used the PRN lopressor. She had some single beat palpitations that felt like PVCs to her after her vaccines only. She remains very active, exercises regularly with no CP, palpitations SOB/DOE. No changes in any way to her exertional capacity No dizzy spells, near syncope or syncope.  Past Medical History:  Diagnosis Date   Breast calcification, left 06/17/2012   Breast calcifications    left breast   GERD (gastroesophageal reflux disease)    Multifocal atrial tachycardia ([facility]) 01/27/2017   MVP (mitral valve prolapse)     Past Surgical History:  Procedure Laterality Date   CATARACT EXTRACTION     lt eye   CHOLECYSTECTOMY  1990   EYE SURGERY     lt eye age 43 and 5 cong cataracts   WISDOM TOOTH EXTRACTION  1992    Current Outpatient Medications  Medication Sig Dispense Refill   Calcium 200 MG TABS Take 200 mg by  mouth in the morning and at bedtime.     Cholecalciferol (VITAMIN D PO) Take by mouth.     Licorice Deglycyrrhizinated POWD Take 2 tablets by mouth in the morning and at bedtime.     metoprolol tartrate (LOPRESSOR) 25 MG tablet Take 1 tablet (25 mg total) by mouth daily as needed (PALPITATIONS). 30 tablet 1   Multiple Vitamin (MULTIVITAMIN) capsule Take 1 capsule by mouth daily.     Omega-3 1000 MG CAPS Take 1,000 mg by mouth daily.     Probiotic Product (PROBIOTIC PO) Take 1 capsule by mouth daily.     No current facility-administered medications for this visit.    Allergies:   Acetaminophen, Aspirin, Percocet [oxycodone-acetaminophen], and Vicodin [hydrocodone-acetaminophen]   Social History:  The patient  reports that she has never smoked. She has never used smokeless tobacco. She reports that she does not drink alcohol and does not use drugs.   Family History:  The patient's family history includes Cancer in her maternal grandmother, maternal uncle, and paternal grandfather; Heart disease in her father.  ROS:  Please see the history of present illness.    All other systems are reviewed and otherwise negative.   PHYSICAL EXAM:  VS:  BP 112/66 (BP Location: Left Arm, Patient Position: Sitting, Cuff Size: Normal)   Pulse 80   Ht 5' 5.5" (1.664 m)   Wt 112 lb (50.8 kg)   SpO2 98%   BMI 18.35 kg/m  BMI: Body mass index is 18.35 kg/m. Well nourished, well developed, very thin body habitus, in no acute distress  HEENT: normocephalic, atraumatic  Neck: no JVD, carotid bruits or masses Cardiac:   RRR; no significant murmurs, no rubs, or gallops Lungs:   CTA b/l, no wheezing, rhonchi or rales  Abd: soft, nontender MS: Pectus excavatum, arachnodactyly, no atrophy Ext:  no edema  Skin: warm and dry, no rash Neuro:  No gross deficits appreciated Psych: euthymic mood, full affect   EKG:   Done today and reviewed by myself:  SR 80bpm, small Q V1-2, T inv she has has some similar  looking EKGs in the past  03/05/17: Cardiac MRI IMPRESSION: 1. Normal left ventricular size, thickness and systolic function (LVEF = 60%). There are no regional wall motion abnormalities.   There is no late gadolinium enhancement in the left ventricular myocardium.   2. Normal right ventricular size, thickness and systolic function (LVEF = 53%). There are no regional wall motion abnormalities.   3. Mitral valve leaflets appear myxomatous thickened with mild anterior leaflet prolapse but only mild mitral regurgitation. Trivial tricuspid regurgitation.   4. Mildly dilated left atrium.   5. Normal size of the aortic root, ascending aorta.   There is no evidence for infiltrative or inflammatory cardiomyopathy or arrhythmogenic right ventricular dysplasia.  01/27/17: TTE Study Conclusions - Left ventricle: The  cavity size was normal. Wall thickness was   normal. Systolic function was normal. The estimated ejection   fraction was in the range of 60% to 65%. 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 Calcified annulus.   Recent Labs: No results found for requested labs within last 8760 hours.  No results found for requested labs within last 8760 hours.   CrCl cannot be calculated (Patient's most recent lab result is older than the maximum 21 days allowed.).   Wt Readings from Last 3 Encounters:  11/14/20 112 lb (50.8 kg)  04/09/19 110 lb (49.9 kg)  03/23/18 107 lb (48.5 kg)     Other studies reviewed: Additional studies/records reviewed today include: summarized above  ASSESSMENT AND PLAN:  1. ATach     Stress and some foods are triggers for her     None in almost 2 years     She has never used her PRN lopressor       Disposition: we will continue to see her annually or PRN   Current medicines are reviewed at length with the patient today.  The patient did not have any concerns regarding medicines.  Norma Fredrickson,  PA-C 11/14/2020 9:28 AM     [facility] [address] [place] [address] [phone] (office)  [phone] (fax)

## 2020-11-14 ENCOUNTER — Encounter: Payer: Self-pay | Admitting: Physician Assistant

## 2020-11-14 ENCOUNTER — Ambulatory Visit: Payer: BC Managed Care – PPO | Admitting: Physician Assistant

## 2020-11-14 ENCOUNTER — Other Ambulatory Visit: Payer: Self-pay

## 2020-11-14 VITALS — BP 112/66 | HR 80 | Ht 65.5 in | Wt 112.0 lb

## 2020-11-14 DIAGNOSIS — I471 Supraventricular tachycardia: Secondary | ICD-10-CM

## 2020-11-14 MED ORDER — METOPROLOL TARTRATE 25 MG PO TABS: 25.0000 mg | ORAL_TABLET | Freq: Every day | ORAL | 0 refills | Status: DC | PRN

## 2020-11-14 NOTE — Addendum Note (Signed)
Addended by: Oleta Mouse on: 11/14/2020 10:11 AM   Modules accepted: Orders

## 2020-11-14 NOTE — Patient Instructions (Addendum)
Medication Instructions:   Your physician recommends that you continue on your current medications as directed. Please refer to the Current Medication list given to you today.   *If you need a refill on your cardiac medications before your next appointment, please call your pharmacy*   Lab Work: NONE ORDERED  TODAY   If you have labs (blood work) drawn today and your tests are completely normal, you will receive your results only by: MyChart Message (if you have MyChart) OR A paper copy in the mail If you have any lab test that is abnormal or we need to change your treatment, we will call you to review the results.   Testing/Procedures: NONE ORDERED  TODAY     Follow-Up: At [facility], you and your health needs are our priority.  As part of our continuing mission to provide you with exceptional heart care, we have created designated Provider Care Teams.  These Care Teams include your primary Cardiologist (physician) and Advanced Practice Providers (APPs -  Physician Assistants and Nurse Practitioners) who all work together to provide you with the care you need, when you need it.  We recommend signing up for the patient portal called "MyChart".  Sign up information is provided on this After Visit Summary.  MyChart is used to connect with patients for Virtual Visits (Telemedicine).  Patients are able to view lab/test results, encounter notes, upcoming appointments, etc.  Non-urgent messages can be sent to your provider as well.   To learn more about what you can do with MyChart, go to [email or web address].    Your next appointment:   1 year(s) or CONTACT [facility] [phone] AS NEEDED FOR  ANY CARDIAC RELATED SYMPTOMS   The format for your next appointment:   In Person  Provider:   You may see Dr. Graciela Husbands  or one of the following Advanced Practice Providers on your designated Care Team:   Francis Dowse, [place]    Other Instructions

## 2020-11-22 LAB — COLOGUARD: COLOGUARD: NEGATIVE

## 2021-02-15 ENCOUNTER — Other Ambulatory Visit: Payer: Self-pay

## 2021-02-15 ENCOUNTER — Encounter: Payer: Self-pay | Admitting: Emergency Medicine

## 2021-02-15 ENCOUNTER — Ambulatory Visit
Admission: EM | Admit: 2021-02-15 | Discharge: 2021-02-15 | Disposition: A | Discharge status: Home / Self Care | Payer: BC Managed Care – PPO

## 2021-02-15 DIAGNOSIS — S61216A Laceration without foreign body of right little finger without damage to nail, initial encounter: Secondary | ICD-10-CM | POA: Diagnosis not present

## 2021-02-15 DIAGNOSIS — S61216S Laceration without foreign body of right little finger without damage to nail, sequela: Secondary | ICD-10-CM

## 2021-02-15 NOTE — ED Provider Notes (Signed)
Renaldo Fiddler    CSN: [id number] Arrival date & time: 02/15/21  1542      History   Chief Complaint Chief Complaint  Patient presents with   Laceration    HPI Linzy Laury is a 60 y.o. female who cut her R pinky on sharp plastic. She has washed the wound with soap and water and placed pressure on the wound and took 45 min to stop bleeding. She did not see any FB in it. When she got here she uses hand sanitizer and did not feel any pain.  Last TD 73 y ago. Declines getting one.   Past Medical History:  Diagnosis Date   Breast calcification, left 06/17/2012   Breast calcifications    left breast   GERD (gastroesophageal reflux disease)    Multifocal atrial tachycardia ([facility]) 01/27/2017   MVP (mitral valve prolapse)     Patient Active Problem List   Diagnosis Date Noted   Multifocal atrial tachycardia ([facility]) 01/27/2017   Breast calcification, left 06/17/2012    Past Surgical History:  Procedure Laterality Date   CATARACT EXTRACTION     lt eye   CHOLECYSTECTOMY  1990   EYE SURGERY     lt eye age 37 and 5 cong cataracts   WISDOM TOOTH EXTRACTION  1992    OB History   No obstetric history on file.      Home Medications    Prior to Admission medications   Medication Sig Start Date End Date Taking? Authorizing Provider  Calcium 200 MG TABS Take 200 mg by mouth in the morning and at bedtime. 05/20/17   [provider]  Cholecalciferol (VITAMIN D PO) Take by mouth.    [provider]  Licorice Deglycyrrhizinated POWD Take 2 tablets by mouth in the morning and at bedtime. 05/20/17   [provider]  metoprolol tartrate (LOPRESSOR) 25 MG tablet Take 1 tablet (25 mg total) by mouth daily as needed (PALPITATIONS). 11/14/20   Sheilah Pigeon, PA-C  Multiple Vitamin (MULTIVITAMIN) capsule Take 1 capsule by mouth daily.    [provider]  Omega-3 1000 MG CAPS Take 1,000 mg by mouth daily.    [provider]  Probiotic Product  (PROBIOTIC PO) Take 1 capsule by mouth daily.    [provider]    Family History Family History  Problem Relation Age of Onset   Heart disease Father    Cancer Maternal Uncle        prostate   Cancer Maternal Grandmother        pancreatic   Cancer Paternal Grandfather        throat    Social History Social History   Tobacco Use   Smoking status: Never   Smokeless tobacco: Never  Vaping Use   Vaping Use: Never used  Substance Use Topics   Alcohol use: No   Drug use: No     Allergies   Acetaminophen, Aspirin, Percocet [oxycodone-acetaminophen], and Vicodin [hydrocodone-acetaminophen]   Review of Systems Review of Systems Cut on R little finger, no numbness or tingling.   Physical Exam Triage Vital Signs ED Triage Vitals  Enc Vitals Group     BP 02/15/21 1559 (!) 146/70     Pulse Rate 02/15/21 1559 86     Resp 02/15/21 1559 18     Temp 02/15/21 1559 99.4 F (37.4 C)     Temp Source 02/15/21 1559 Oral     SpO2 02/15/21 1559 97 %  Weight --      Height --      Head Circumference --      Peak Flow --      Pain Score 02/15/21 1607 0     Pain Loc --      Pain Edu? --      Excl. in GC? --    No data found.  Updated Vital Signs BP (!) 146/70 (BP Location: Left Arm)   Pulse 86   Temp 99.4 F (37.4 C) (Oral)   Resp 18   SpO2 97%   Visual Acuity Right Eye Distance:   Left Eye Distance:   Bilateral Distance:    Right Eye Near:   Left Eye Near:    Bilateral Near:     Physical Exam Vitals reviewed.  Constitutional:      General: She is not in acute distress.    Appearance: She is normal weight. She is not toxic-appearing.  HENT:     Head: Normocephalic.     Right Ear: External ear normal.     Left Ear: External ear normal.  Eyes:     General: No scleral icterus.    Conjunctiva/sclera: Conjunctivae normal.  Pulmonary:     Effort: Pulmonary effort is normal.  Musculoskeletal:        General: Normal range of motion.     Cervical  back: Neck supple.  Skin:    General: Skin is warm and dry.     Findings: No rash.     Comments: R Small finger with 1.5 cm length laceration which is clean and not bleeding on the volar  mild finger area. ROM is normal.   Neurological:     Mental Status: She is alert and oriented to person, place, and time.     Gait: Gait normal.  Psychiatric:        Mood and Affect: Mood normal.        Behavior: Behavior normal.        Thought Content: Thought content normal.        Judgment: Judgment normal.     UC Treatments / Results  Labs (all labs ordered are listed, but only abnormal results are displayed) Labs Reviewed - No data to display  EKG   Radiology No results found.  Procedures Procedures (including critical care time) I applied triple antibiotic ointment on cut and 2 1/4 inch steri trips and pressure dressing with coban. Medications Ordered in UC Medications - No data to display  Initial Impression / Assessment and Plan / UC Course  I have reviewed the triage vital signs and the nursing notes. Laceration R small finger She declined TD Wound care instructions reviewed.   Final Clinical Impressions(s) / UC Diagnoses   Final diagnoses:  Laceration of right little finger without foreign body without damage to nail, sequela   Discharge Instructions   None    ED Prescriptions   None    PDMP not reviewed this encounter.   Garey Ham, [place] 02/15/21 1854

## 2021-02-15 NOTE — ED Triage Notes (Signed)
Pt here with a cut on right pinky from a piece of plastic in [employer]. Not actively bleeding and is not hurting.

## 2021-02-19 ENCOUNTER — Other Ambulatory Visit: Payer: Self-pay | Admitting: Physician Assistant

## 2022-02-04 NOTE — Progress Notes (Unsigned)
Cardiology Office Note Date:  02/04/2022  Patient ID:  Heidi Aguilar, Heidi Aguilar 06-21-1960, MRN [id number] PCP:  Mila Palmer, MD  Cardiologist:  New at hospital, Dr. Graciela Husbands    Chief Complaint:  annual visit  History of Present Illness: Heidi Aguilar is a 61 y.o. female with history of of GERD, reports MVP, very breif episodes of ATach..  The patient was seen in the hospital 2018  by myself and Dr.Klein, she described:: 4-5 years of occ palpitations/skipped beat type feelings she has been told were PVCs (by her PMD) and of no concern, they were infrequent and not bothersome to her and nothing more was done.  She reports since her teens being told she had MVP and has had a few echos over the years, always only mentioning MVP without any other abnormalities, the last perhaps 10 years or so ago.  She has never seen a cardiologist, and have been done via her PMD service.  Last night she developed unusual palpitations that were brief but more of a fluttering sensation with pressure on the sides of her neck when they would occur.  No CP or SOB, no dizziness, near syncope or syncope.  They persisted, always brief only seconds but recurrently, these were making her feel anxious and after about 45 minutes decided to get checked out.   After discussion it was the patient preference to pursue a very conservative approach and was rx PRN lopressor.  An echo was ordered and she was planned for discharge.  Dr. Graciela Husbands pointed out her pectus deformity and arachnodactyly with need for screen particularly for Marfan's, suggested if Ao not well visualized to get CT or other modality and if Ao root dilated will need chronic BB.  I saw her Oct 2018, she had not had any recurrent persistent palpitations.  She had had the quick skipped beat that she has had over the years attributed to PVCs only.  She had not had to take the lopressor.  No CP, back pain or SOB, no dizziness, near syncope or syncope, feels well. Her echo was  reviewed, and reviewed with DOD who felt it inadequate to assess Aoritc root, and cardiac MR was done noted below with normal root.  At our visit Nov 2019, she continued to do quite well.  She has had 2 episodes of tachycardia, both triggered by stressful events and self terminated with relaxation efforts.  She had not needed to use the lopressor.  No CP, SOB or exertional intolerances, no near syncope or syncope.  She was following her labs with her PMD.  No changes were made, discussed stress management  I saw her 04/09/2019 She is again doing well   She reports only 3-4 episodes of her tachycardia, all self resolved or by stretching/relaxing.  She has noted that stress is aa trigger, and when she gets off her usual eating/food pattern as well.  She remeins very active physically , no exertional intolerances, no dizzy spells, near syncope or syncope, no CP, SOB, DOE. Discussed stress management strategies, no changes were made, planned for annual and/or PRN visit  I saw her 11/14/20 She continues to do very well The last time she had any tachycardia was Aug 2020. She has never used the PRN lopressor. She had some single beat palpitations that felt like PVCs to her after her vaccines only. She remains very active, exercises regularly with no CP, palpitations SOB/DOE. No changes in any way to her exertional capacity No dizzy spells, near syncope or  syncope.  *** SK next, or PRN *** palps *** prn lopressor?   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     Cholecalciferol (VITAMIN D PO) Take by mouth.     Licorice Deglycyrrhizinated POWD Take 2 tablets by mouth in the morning and at bedtime.     metoprolol tartrate (LOPRESSOR) 25 MG tablet TAKE 1 TABLET (25 MG TOTAL) BY MOUTH DAILY AS NEEDED (PALPITATIONS). 90 tablet 0   Multiple Vitamin (MULTIVITAMIN) capsule Take 1 capsule by mouth daily.     Omega-3 1000 MG CAPS Take 1,000 mg by mouth daily.     Probiotic Product (PROBIOTIC PO) Take 1 capsule by mouth daily.     No current facility-administered medications for this visit.    Allergies:   Acetaminophen, Aspirin, Percocet [oxycodone-acetaminophen], and Vicodin [hydrocodone-acetaminophen]   Social History:  The patient  reports that she has never smoked. She has never used smokeless tobacco. She reports that she does not drink alcohol and does not use drugs.   Family History:  The patient's family history includes Cancer in her maternal grandmother, maternal uncle, and paternal grandfather; Heart disease in her father.  ROS:  Please see the history of present illness.    All other systems are reviewed and otherwise negative.   PHYSICAL EXAM:  VS:  There were no vitals taken for this visit. BMI: There is no height or weight on file to calculate BMI. Well nourished, well developed, very thin body habitus, in no acute distress  HEENT: normocephalic, atraumatic  Neck: no JVD, carotid bruits or masses Cardiac:   *** RRR; no significant murmurs, no rubs, or gallops Lungs:   *** CTA b/l, no wheezing, rhonchi or rales  Abd: soft, nontender MS: *** Pectus excavatum, arachnodactyly, no atrophy Ext:  *** no edema  Skin: warm and dry, no rash Neuro:  No gross deficits appreciated Psych: euthymic mood, full affect   EKG:   Done today and reviewed by myself:  *** 11/14/2020: SR 80bpm, small Q V1-2, T inv she has has some similar looking  EKGs in the past  03/05/17: Cardiac MRI IMPRESSION: 1. Normal left ventricular size, thickness and systolic function (LVEF = 60%). There are no regional wall motion abnormalities.   There is no late gadolinium enhancement in the left ventricular myocardium.   2. Normal right ventricular size, thickness and systolic function (LVEF = 53%). There are no regional wall motion abnormalities.   3. Mitral valve leaflets appear myxomatous thickened with mild anterior leaflet prolapse but only mild mitral regurgitation. Trivial tricuspid regurgitation.   4. Mildly dilated left atrium.   5. Normal size of the aortic root, ascending aorta.   There is no evidence for infiltrative or inflammatory cardiomyopathy or arrhythmogenic right ventricular dysplasia.  01/27/17: TTE Study Conclusions - Left ventricle: The cavity size was normal. Wall thickness was  normal. Systolic function was normal. The estimated ejection   fraction was in the range of 60% to 65%. 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 Calcified annulus.   Recent Labs: No results found for requested labs within last 365 days.  No results found for requested labs within last 365 days.   CrCl cannot be calculated (Patient's most recent lab result is older than the maximum 21 days allowed.).   Wt Readings from Last 3 Encounters:  11/14/20 112 lb (50.8 kg)  04/09/19 110 lb (49.9 kg)  03/23/18 107 lb (48.5 kg)     Other studies reviewed: Additional studies/records reviewed today include: summarized above  ASSESSMENT AND PLAN:  1. ATach     Stress and some foods are triggers for her     *** None in almost *** 2 years     *** She has never used her PRN lopressor       Disposition: we will continue to see her annually or PRN   Current medicines are reviewed at length with the patient today.  The patient did not have any concerns regarding medicines.  Venetia Night,  PA-C 02/04/2022 11:10 AM     [facility] [address] [address] [phone] (office)  [phone] (fax)

## 2022-02-06 ENCOUNTER — Ambulatory Visit: Payer: BC Managed Care – PPO | Attending: Physician Assistant | Admitting: Physician Assistant

## 2022-02-06 ENCOUNTER — Encounter: Payer: Self-pay | Admitting: Physician Assistant

## 2022-02-06 VITALS — BP 126/72 | HR 79 | Ht 65.0 in | Wt 116.0 lb

## 2022-02-06 DIAGNOSIS — I471 Supraventricular tachycardia: Secondary | ICD-10-CM | POA: Diagnosis not present

## 2022-02-06 NOTE — Patient Instructions (Addendum)
Medication Instructions:    STOP TAKING AND REMOVE THIS MEDICATION FROM YOUR MEDICATION LIST: LOPRESSOR   *If you need a refill on your cardiac medications before your next appointment, please call your pharmacy*   Lab Work: [place]   If you have labs (blood work) drawn today and your tests are completely normal, you will receive your results only by: [place] (if you have MyChart) OR A paper copy in the mail If you have any lab test that is abnormal or we need to change your treatment, we will call you to review the results.   Testing/Procedures: NONE ORDERED  TODAY     Follow-Up: At [facility], you and your health needs are our priority.  As part of our continuing mission to provide you with exceptional heart care, we have created designated Provider Care Teams.  These Care Teams include your primary Cardiologist (physician) and Advanced Practice Providers (APPs -  Physician Assistants and Nurse Practitioners) who all work together to provide you with the care you need, when you need it.  We recommend signing up for the patient portal called "MyChart".  Sign up information is provided on this After Visit Summary.  MyChart is used to connect with patients for Virtual Visits (Telemedicine).  Patients are able to view lab/test results, encounter notes, upcoming appointments, etc.  Non-urgent messages can be sent to your provider as well.   To learn more about what you can do with MyChart, go to [email or web address].    Your next appointment:   1 year(s) CONTACT [facility] [phone] AS NEEDED FOR  ANY CARDIAC RELATED SYMPTOMS   The format for your next appointment:   In Person  Provider:   Virl Axe, MD    Other Instructions   Important Information About [place]

## 2022-11-07 ENCOUNTER — Other Ambulatory Visit: Payer: Self-pay | Admitting: Family Medicine

## 2022-11-15 LAB — CYTOLOGY - PAP: Adequacy: ABNORMAL

## 2023-06-18 NOTE — Progress Notes (Unsigned)
Cardiology Office Note Date:  06/18/2023  Patient ID:  Heidi, Aguilar Sep 19, 1960, MRN [id number] PCP:  Mila Palmer, MD  Cardiologist:  Dr. Graciela Husbands    Chief Complaint:   over-due annual visit  History of Present Illness: Heidi Aguilar is a 63 y.o. female with history of of GERD, reports MVP, very breif episodes of ATach..  The patient was seen in the hospital 2018  by myself and Dr.Klein, she described:: 4-5 years of occ palpitations/skipped beat type feelings she has been told were PVCs (by her PMD) and of no concern, they were infrequent and not bothersome to her and nothing more was done.  She reports since her teens being told she had MVP and has had a few echos over the years, always only mentioning MVP without any other abnormalities, the last perhaps 10 years or so ago.  She has never seen a cardiologist, and have been done via her PMD service.  Last night she developed unusual palpitations that were brief but more of a fluttering sensation with pressure on the sides of her neck when they would occur.  No CP or SOB, no dizziness, near syncope or syncope.  They persisted, always brief only seconds but recurrently, these were making her feel anxious and after about 45 minutes decided to get checked out.   After discussion it was the patient preference to pursue a very conservative approach and was rx PRN lopressor.  An echo was ordered and she was planned for discharge.  Dr. Graciela Husbands pointed out her pectus deformity and arachnodactyly with need for screen particularly for Marfan's, suggested if Ao not well visualized to get CT or other modality and if Ao root dilated will need chronic BB.  I saw her Oct 2018, she had not had any recurrent persistent palpitations.  She had had the quick skipped beat that she has had over the years attributed to PVCs only.  She had not had to take the lopressor.  No CP, back pain or SOB, no dizziness, near syncope or syncope, feels well. Her echo was reviewed,  and reviewed with DOD who felt it inadequate to assess Aoritc root, and cardiac MR was done noted below with normal root.  At our visit Nov 2019, she continued to do quite well.  She has had 2 episodes of tachycardia, both triggered by stressful events and self terminated with relaxation efforts.  She had not needed to use the lopressor.  No CP, SOB or exertional intolerances, no near syncope or syncope.  She was following her labs with her PMD.  No changes were made, discussed stress management  I saw her 04/09/2019 She is again doing well   She reports only 3-4 episodes of her tachycardia, all self resolved or by stretching/relaxing.  She has noted that stress is aa trigger, and when she gets off her usual eating/food pattern as well.  She remeins very active physically , no exertional intolerances, no dizzy spells, near syncope or syncope, no CP, SOB, DOE. Discussed stress management strategies, no changes were made, planned for annual and/or PRN visit  I saw her 11/14/20 She continues to do very well The last time she had any tachycardia was Aug 2020. She has never used the PRN lopressor. She had some single beat palpitations that felt like PVCs to her after her vaccines only. She remains very active, exercises regularly with no CP, palpitations SOB/DOE. No changes in any way to her exertional capacity No dizzy spells, near syncope or syncope.  I aw her 02/06/22 She continues to  do great. Stays very active and busy Has a new puppy. She doesn't think she has had any AT since her last visit, rare;y a fleeting skipped beat, none that she ha had to "stop. Doesn't know if she has ever used the PRN metoprolol and would like to stop refills. She knows her triggers (stress, ertain foods) and as long as she manages those her palpitations/AT is well controlled She has been able to stop them with vagal maneuvers  No CP, SOB, DOE Sees her PMD annually has labs there She was not using/needing PRN BB  and she requested that we no longer prescribe it Planned for annual visits  TODAY She continues to do quite well Very active, exercises regularly, gets out in the yard with her dag. No CP, SOB, DOE No near syncope or syncope. Twice in the fall she had a couple dizzy episodes The 1st about 30 seconds, definitely a sense of motion/instability, largely resolved after about 3 seconds> though remained there slightly until she went to bed The next the same, though again 30 seconds, no lingering sensation She did not at all feel like she was fainting, suspected was her allergies/sinuses, felt like a bit of vertigo  She has regular palpitations, well aware of her PVCs, some days/evenings more then others Doesn't think the burden of these is any more or less then they have been. 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     Calcium 200 MG TABS Take 200 mg by mouth in the morning and at bedtime.     Cholecalciferol (VITAMIN D PO) Take by mouth.     Licorice Deglycyrrhizinated POWD Take 2 tablets by mouth in the morning and at bedtime.     Multiple Vitamin (MULTIVITAMIN) capsule Take 1 capsule by mouth daily.     Multiple Vitamins-Minerals (IMMUNE SUPPORT PO) Take 1 capsule by mouth in the morning and at bedtime.     Omega-3 1000 MG CAPS Take 1,000 mg by mouth daily.     Probiotic Product (PROBIOTIC PO) Take 1  capsule by mouth daily.     No current facility-administered medications for this visit.    Allergies:   Acetaminophen, Aspirin, Percocet [oxycodone-acetaminophen], and Vicodin [hydrocodone-acetaminophen]   Social History:  The patient  reports that she has never smoked. She has never used smokeless tobacco. She reports that she does not drink alcohol and does not use drugs.   Family History:  The patient's family history includes Cancer in her maternal grandmother, maternal uncle, and paternal grandfather; Heart disease in her father.  ROS:  Please see the history of present illness.    All other systems are reviewed and otherwise negative.   PHYSICAL EXAM:  VS:  There were no vitals taken for this  visit. BMI: There is no height or weight on file to calculate BMI. Well nourished, well developed, very thin body habitus, in no acute distress  HEENT: normocephalic, atraumatic  Neck: no JVD, carotid bruits or masses Cardiac:   RRR; + extrasystoles, 1-2/6 SM, no rubs, or gallops Lungs:  CTA b/l, no wheezing, rhonchi or rales  Abd: soft, nontender MS: Pectus excavatum, arachnodactyly, no atrophy Ext:  no edema  Skin: warm and dry, no rash Neuro:  No gross deficits appreciated Psych: euthymic mood, full affect   EKG:   Done today and reviewed by myself:  SR 78bpm, Q V1-2, PVCs No significant changes  02/06/2022: SR  79bpm, unchanged from prior 11/14/2020: SR 80bpm, small Q V1-2, T inv she has has some similar looking EKGs in the past  03/05/17: Cardiac MRI IMPRESSION: 1. Normal left ventricular size, thickness and systolic function (LVEF = 60%). There are no regional wall motion abnormalities.   There is no late gadolinium enhancement in the left ventricular myocardium.   2. Normal right ventricular size, thickness and systolic function (LVEF = 53%). There are no regional wall motion abnormalities.   3. Mitral valve leaflets appear myxomatous thickened with mild anterior  leaflet prolapse but only mild mitral regurgitation. Trivial tricuspid regurgitation.   4. Mildly dilated left atrium.   5. Normal size of the aortic root, ascending aorta.   There is no evidence for infiltrative or inflammatory cardiomyopathy or arrhythmogenic right ventricular dysplasia.  01/27/17: TTE Study Conclusions - Left ventricle: The cavity size was normal. Wall thickness was   normal. Systolic function was normal. The estimated ejection   fraction was in the range of 60% to 65%. 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 Calcified annulus.   Recent Labs: No results found for requested labs within last 365 days.  No results found for requested labs within last 365 days.   CrCl cannot be calculated (Patient's most recent lab result is older than the maximum 21 days allowed.).   Wt Readings from Last 3 Encounters:  02/06/22 116 lb (52.6 kg)  11/14/20 112 lb (50.8 kg)  04/09/19 110 lb (49.9 kg)     Other studies reviewed: Additional studies/records reviewed today include: summarized above  ASSESSMENT AND PLAN:  1. ATach     Stress and some foods are triggers for her     2. PVCs 2 on her EKG, a few on exam 3 day monitor to assess burden  3. SM on exam Update her echo      Disposition: back otherwise again in a year, sooner if needed, (pending the above testing results) plan to transition to Dr. Jimmey Ralph after Dr. Odessa Fleming retirement    Current medicines are reviewed at length with the patient today.  The patient did not have any concerns regarding medicines.  Norma Fredrickson, PA-C 06/18/2023 7:38 AM     [facility] [address] [place] [address] [phone] (office)  [phone] (fax)

## 2023-06-19 ENCOUNTER — Encounter: Payer: Self-pay | Admitting: Physician Assistant

## 2023-06-19 ENCOUNTER — Ambulatory Visit: Payer: 59 | Attending: Physician Assistant | Admitting: Physician Assistant

## 2023-06-19 ENCOUNTER — Ambulatory Visit (INDEPENDENT_AMBULATORY_CARE_PROVIDER_SITE_OTHER): Payer: 59

## 2023-06-19 VITALS — BP 118/70 | HR 78 | Ht 65.0 in | Wt 115.8 lb

## 2023-06-19 DIAGNOSIS — I493 Ventricular premature depolarization: Secondary | ICD-10-CM

## 2023-06-19 DIAGNOSIS — I4719 Other supraventricular tachycardia: Secondary | ICD-10-CM

## 2023-06-19 DIAGNOSIS — R011 Cardiac murmur, unspecified: Secondary | ICD-10-CM

## 2023-06-19 DIAGNOSIS — R002 Palpitations: Secondary | ICD-10-CM | POA: Diagnosis not present

## 2023-06-19 NOTE — Progress Notes (Unsigned)
Enrolled for [employer] to mail a ZIO XT long term holter monitor to the patients address on file.   Dr. Graciela Husbands to read.

## 2023-06-19 NOTE — Patient Instructions (Signed)
Your physician recommends that you continue on your current medications as directed. Please refer to the Current Medication list given to you today.  *If you need a refill on your cardiac medications before your next appointment, please call your pharmacy*   Lab Work:  NONE ORDERED  TODAY   If you have labs (blood work) drawn today and your tests are completely normal, you will receive your results only by: MyChart Message (if you have MyChart) OR A paper copy in the mail If you have any lab test that is abnormal or we need to change your treatment, we will call you to review the results.   Testing/Procedures:  Your physician has requested that you have an echocardiogram. Echocardiography is a painless test that uses sound waves to create images of your heart. It provides your doctor with information about the size and shape of your heart and how well your heart's chambers and valves are working. This procedure takes approximately one hour. There are no restrictions for this procedure. Please do NOT wear cologne, perfume, aftershave, or lotions (deodorant is allowed). Please arrive 15 minutes prior to your appointment time.  Please note: We ask at that you not bring children with you during ultrasound (echo/ vascular) testing. Due to room size and safety concerns, children are not allowed in the ultrasound rooms during exams. Our front office staff cannot provide observation of children in our lobby area while testing is being conducted. An adult accompanying a patient to their appointment will only be allowed in the ultrasound room at the discretion of the ultrasound technician under special circumstances. We apologize for any inconvenience.   Your physician has recommended that you wear an event monitor. Event monitors are medical devices that record the heart's electrical activity. Doctors most often [place] these monitors to diagnose arrhythmias. Arrhythmias are problems with the speed or  rhythm of the heartbeat. The monitor is a small, portable device. You can wear one while you do your normal daily activities. This is usually used to diagnose what is causing palpitations/syncope (passing out). INSTRUCTIONS COME IN  BOX IN MAIL      Follow-Up: At [facility], you and your health needs are our priority.  As part of our continuing mission to provide you with exceptional heart care, we have created designated Provider Care Teams.  These Care Teams include your primary Cardiologist (physician) and Advanced Practice Providers (APPs -  Physician Assistants and Nurse Practitioners) who all work together to provide you with the care you need, when you need it.  We recommend signing up for the patient portal called "MyChart".  Sign up information is provided on this After Visit Summary.  MyChart is used to connect with patients for Virtual Visits (Telemedicine).  Patients are able to view lab/test results, encounter notes, upcoming appointments, etc.  Non-urgent messages can be sent to your provider as well.   To learn more about what you can do with MyChart, go to [email or web address].    Your next appointment:   1 year(s)  Provider:   Francis Dowse, PA-C    Other Instructions       1st Floor: - Lobby - Registration  - Pharmacy  - Lab - Cafe  2nd Floor: - PV Lab - Diagnostic Testing (echo, CT, nuclear med)  3rd Floor: - Vacant  4th Floor: - TCTS (cardiothoracic surgery) - AFib Clinic - Structural Heart Clinic - Vascular Surgery  - Vascular Ultrasound  5th Floor: - HeartCare Cardiology (general and EP) -  Clinical Pharmacy for coumadin, hypertension, lipid, weight-loss medications, and med management appointments    Valet parking services will be available as well.

## 2023-06-25 DIAGNOSIS — I493 Ventricular premature depolarization: Secondary | ICD-10-CM | POA: Diagnosis not present

## 2023-07-10 ENCOUNTER — Ambulatory Visit (HOSPITAL_COMMUNITY): Payer: 59 | Attending: Cardiology

## 2023-07-10 DIAGNOSIS — R011 Cardiac murmur, unspecified: Secondary | ICD-10-CM

## 2023-07-10 LAB — ECHOCARDIOGRAM COMPLETE
Area-P 1/2: 4.49 cm2
S' Lateral: 3 cm

## 2024-03-12 ENCOUNTER — Ambulatory Visit

## 2024-03-12 VITALS — BP 112/68 | HR 79 | Temp 98.3°F | Ht 64.75 in | Wt 118.0 lb

## 2024-03-12 DIAGNOSIS — K219 Gastro-esophageal reflux disease without esophagitis: Secondary | ICD-10-CM | POA: Insufficient documentation

## 2024-03-12 DIAGNOSIS — T7840XA Allergy, unspecified, initial encounter: Secondary | ICD-10-CM | POA: Insufficient documentation

## 2024-03-12 DIAGNOSIS — Z131 Encounter for screening for diabetes mellitus: Secondary | ICD-10-CM

## 2024-03-12 DIAGNOSIS — E559 Vitamin D deficiency, unspecified: Secondary | ICD-10-CM | POA: Diagnosis not present

## 2024-03-12 DIAGNOSIS — Z136 Encounter for screening for cardiovascular disorders: Secondary | ICD-10-CM | POA: Diagnosis not present

## 2024-03-12 DIAGNOSIS — Z113 Encounter for screening for infections with a predominantly sexual mode of transmission: Secondary | ICD-10-CM | POA: Diagnosis not present

## 2024-03-12 DIAGNOSIS — I341 Nonrheumatic mitral (valve) prolapse: Secondary | ICD-10-CM | POA: Insufficient documentation

## 2024-03-12 NOTE — Progress Notes (Signed)
 Subjective:   This visit was conducted in person. The patient gave informed consent to the use of [employer] AI technology to record the contents of the encounter as documented below.   Patient ID: Heidi Aguilar, female    DOB: Jan 05, 1961, 63 y.o.   MRN: [id number]   Heidi Aguilar is a very pleasant 63 y.o. female who presents today as a new patient.  Past medical, surgical and family history: Reviewed and updated in chart.  Allergies: Reviewed and updated in chart.  Medications: Reviewed and updated in chart.  Social history: Reviewed and updated in chart.  Last PCP and reason for leaving: PCP left  Discussed the use of AI scribe software for clinical note transcription with the patient, who gave verbal consent to proceed.  History of Present Illness Heidi Aguilar is a 63 year old female who presents as a new patient.  She has a history of multifocal atrial tachycardia diagnosed in 2018. Her symptoms have resolved after identifying and eliminating dietary triggers that caused gastrointestinal discomfort and exacerbated her heart symptoms. She sees a cardiologist or PA once a year. She takes calcium supplements and has noticed improvement in her heart symptoms when maintaining adequate calcium intake.  She was diagnosed with mitral valve prolapse at age 22, which has not significantly hindered her activities. She remains active, engaging in regular exercise, including 'bodies in motion' videos and playing with her dog.  Her past medical history includes a benign breast biopsy in 2014, cataract surgery, and gallbladder removal in 1990. She also has gastroesophageal reflux disease, for which she uses licorice powder as a remedy.  She has a history of vitamin D deficiency, with levels previously as low as 11.5 several yrs ago. She takes 5000 IU of vitamin D a few days a week during winter, in addition to 2000 IU from her multivitamin. Her levels have been stable between 50-64 over the past few  years. She also takes a multivitamin, omega-3, and a probiotic.  She experiences seasonal allergies, particularly to oak tree pollen and molds, affecting her eyes. She has a history of toenail fungus and red spots on her feet, for which she has been using ketoconazole cream for the past month.  Her family history includes heart disease in her father, endometrial cancer in her sister, and melanoma in her brother. She does not consume alcohol, smoke, or use any substances.  She lives with her husband and a dog, is retired, and previously worked as a Scientist, product/process development, Armed forces operational officer, and for the town of [place]. Her diet is largely unprocessed, with a focus on natural foods and low sugar intake.    Review of Systems  All other systems reviewed and are negative.       BP 112/68 (BP Location: Left Arm, Patient Position: Sitting, Cuff Size: Normal)   Pulse 79   Temp 98.3 F (36.8 C) (Oral)   Ht 5' 4.75 (1.645 m)   Wt 118 lb (53.5 kg)   LMP 10/28/2011   SpO2 98%   BMI 19.79 kg/m   Objective:     Physical Exam GENERAL: Alert, cooperative, well developed, no acute distress. HEAD: Normocephalic atraumatic. EYES: Extraocular movements intact BL, conjunctivae normal BL. EXTREMITIES: No cyanosis or edema. NEUROLOGICAL: Oriented to person, place and time, no gait abnormalities, moves all extremities without gross motor or sensory deficit.       Assessment & Plan:   Assessment & Plan 1. Vitamin D deficiency (Primary) New patient, past medical and social history  thoroughly reviewed and updated in chart.  Previous levels in 50s-60s. Not taking vitamin D in summer due to sun exposure. Discussed toxicity risk with high/inappropriate levels of supplementation.  - Order vitamin D level. - Continue 2000 IU vitamin D daily until results available.  2. Screen for STD (sexually transmitted disease) 3. Diabetes mellitus screening 4. Screening for cardiovascular condition - Order fasting  labs: cholesterol, A1c - Declined hep C screening citing that he has already been done - Schedule physical exam in four weeks. - Discuss age-appropriate screenings at next visit.    Return in about 4 weeks (around 04/09/2024) for CPE then schedule fasting labs 1wk prior.   Darianna Amy K Pierre Dellarocco, MD  03/12/24    Contains text generated by [employer].

## 2024-03-12 NOTE — Patient Instructions (Addendum)
 Thank you for visiting [facility] today! Here's what we talked about: - Come back for fasting labs

## 2024-04-01 LAB — COLOGUARD: COLOGUARD: NEGATIVE

## 2024-04-29 ENCOUNTER — Other Ambulatory Visit (INDEPENDENT_AMBULATORY_CARE_PROVIDER_SITE_OTHER)

## 2024-04-29 DIAGNOSIS — Z131 Encounter for screening for diabetes mellitus: Secondary | ICD-10-CM | POA: Diagnosis not present

## 2024-04-29 DIAGNOSIS — E559 Vitamin D deficiency, unspecified: Secondary | ICD-10-CM

## 2024-04-29 DIAGNOSIS — Z136 Encounter for screening for cardiovascular disorders: Secondary | ICD-10-CM

## 2024-04-29 LAB — LIPID PANEL
Cholesterol: 257 mg/dL — ABNORMAL HIGH (ref 0–200)
HDL: 80.7 mg/dL (ref >39.00)
LDL Cholesterol: 160 mg/dL — ABNORMAL HIGH (ref 0–99)
NonHDL: 176.43
Total CHOL/HDL Ratio: 3
Triglycerides: 82 mg/dL (ref 0.0–149.0)
VLDL: 16.4 mg/dL (ref 0.0–40.0)

## 2024-04-29 LAB — VITAMIN D 25 HYDROXY (VIT D DEFICIENCY, FRACTURES): VITD: 35.21 ng/mL (ref 30.00–100.00)

## 2024-04-29 LAB — HEMOGLOBIN A1C: Hgb A1c MFr Bld: 5.4 % (ref 4.6–6.5)

## 2024-05-02 ENCOUNTER — Ambulatory Visit: Payer: Self-pay

## 2024-05-03 ENCOUNTER — Ambulatory Visit (INDEPENDENT_AMBULATORY_CARE_PROVIDER_SITE_OTHER)

## 2024-05-03 VITALS — BP 130/68 | HR 77 | Temp 98.1°F | Ht 64.5 in | Wt 114.0 lb

## 2024-05-03 DIAGNOSIS — E7849 Other hyperlipidemia: Secondary | ICD-10-CM | POA: Diagnosis not present

## 2024-05-03 DIAGNOSIS — Z0000 Encounter for general adult medical examination without abnormal findings: Secondary | ICD-10-CM | POA: Diagnosis not present

## 2024-05-03 DIAGNOSIS — E559 Vitamin D deficiency, unspecified: Secondary | ICD-10-CM | POA: Diagnosis not present

## 2024-05-03 NOTE — Patient Instructions (Signed)
 Thank you for visiting [facility] today! Here's what we talked about: - Continue health diet, lifestyle and Vitamin D supplement

## 2024-05-03 NOTE — Progress Notes (Signed)
 Assessment & Plan:   This visit was conducted in person. The patient gave informed consent to the use of Abridge AI technology to record the contents of the encounter as documented below.  Assessment & Plan Adult Wellness Visit Routine wellness visit. Declined mammograms and age-appropriate vaccines. Negative Cologuard. Opted for Pap smear at next visit due to past unpleasant experiences. Reviewed exercise and diet.  - Repeat Cologuard in 3 years. - Scheduled well woman check in March for breast exam and Pap smear. - Continue current exercise routine and dietary habits.  I have personally reviewed and have noted: 1. The patient's medical and social history 2. Their use of alcohol, tobacco or illicit drugs 3. Their current medications and supplements 4. The patient's functional ability including ADL's, fall risks, home safety risks and hearing or visual impairment. 5. Diet and physical activities 6. Evidence for depression or mood disorder   Colonoscopy: Neg cologuard Mammogram: Mammo in 2017 WNL due.  DEXA: Dexa next yr Pap: Last pap 2018- neg HPV and cytology, needs a last one this time.  Labs: HIV, Hep C negative in 2020 (pt brought in report), Lipid and A1c UTD Immunizations: Declined Flu, shingrix, Td and covid Diet and Exercise: Reducing processed sugars, bodies in motion exercise videos daily 20-25mins Sleep and mood: No concerns Dental and vision: UTD ADLs and IADLs: Capable Fall risk and home safety: No concerns Health counselling given: Healthy diet and exercise given HLD  Vitamin D deficiency Managed with supplementation. Most recent Vit D level in the 30s.   - Continue vitamin D 5000 IU a few days a week during winter.  Hyperlipidemia Cholesterol elevated but not requiring medication. Discussed dietary impact and lifestyle management.  - Continue healthy diet and exercise. - Repeat cholesterol test in one year.    Advanced Directives Patient does not have  advanced directives  Wants to think about it Does not want DNR  Follow-up: Return in about 3 months (around 08/01/2024) for Well woman chesck then 38yr from today for Welcome to Medicare, fasting labs 1wk prior.        Subjective:   Patient ID: Loreli Debruler, female    DOB: 1960/10/06  Age: 63 y.o. MRN: [id number]  Patient Care Team: Bennett Reuben POUR, MD as PCP - General (Family Medicine)   CC:  Chief Complaint  Patient presents with   Annual Exam    Phung Kotas is a 63 y.o. female here for annual physical and f/u on chronic conditions, see assessment and plan for further details  Discussed the use of AI scribe software for clinical note transcription with the patient, who gave verbal consent to proceed.  History of Present Illness Windsor Zirkelbach is a 63 year old female who presents for a routine follow-up visit.  She recently had a negative Cologuard test. She has not had a mammogram since 2017 due to previous experiences with false positives and dense breast tissue. She performs regular self-exams.  She was diagnosed with osteopenia in the past and manages her bone health with calcium, vitamin D, and exercise. She is not interested in bisphosphonates due to potential digestive issues and prefers conservative management. She cannot consume dairy products due to gastroesophageal reflux, limiting her dietary calcium intake.  Her last Pap smear was in 2018, showing normal cytology and HPV. She finds Pap smears extremely painful and is considering foregoing future tests. She has not been sexually active since her last Pap smear.  She has a history of vitamin D deficiency  and requires higher doses during the winter months to maintain adequate levels. She takes 5,000 IU of vitamin D a few days a week in the winter.  She has a history of elevated cholesterol levels, which she manages through diet and exercise. She noticed that processed sugars affect her cholesterol levels and has been mindful  of her sugar intake.  She has a history of heart arrhythmias, which she associates with dietary triggers. She was previously prescribed metoprolol  but did not take it regularly and has since discontinued it.  She has a history of secondhand smoke exposure during childhood but has never smoked herself. She exercises regularly, doing aerobics and weight training, and runs with her dog. She reports good sleep quality, waking once or twice a night to urinate, and no significant mood disturbances.  She has a history of severe reactions to mRNA COVID vaccines and has decided against further vaccinations. She had COVID-19 in 2021 and possibly in 2022, with milder symptoms than the vaccine side effects.      Depression Screening;    05/03/2024   11:24 AM 03/12/2024   10:09 AM  PHQ 2/9 Scores  PHQ - 2 Score 0 0  PHQ- 9 Score 1      Anxiety Screening:     No data to display           ROS: Negative unless specifically indicated above in HPI.       Objective:     BP 130/68   Pulse 77   Temp 98.1 F (36.7 C) (Oral)   Ht 5' 4.5 (1.638 m)   Wt 114 lb (51.7 kg)   LMP 10/28/2011   SpO2 100%   BMI 19.27 kg/m    Physical Exam GENERAL: Alert, cooperative, well developed, no acute distress. HEAD: Normocephalic atraumatic. EYES: Extraocular movements intact bilaterally, pupils round, equal and reactive to light bilaterally, conjunctivae normal bilaterally. EARS: Tympanic membrane, ear canal and external ear normal bilaterally. NOSE: No congestion or rhinorrhea, mucous membranes are moist. THROAT: Oral cavity normal, no oropharyngeal exudate or posterior oropharyngeal erythema. CARDIOVASCULAR: Normal heart rate and rhythm, S1 and S2 normal without murmurs. CHEST: Clear to auscultation bilaterally, no wheezes, rhonchi, or crackles. ABDOMEN: Soft, non tender, non distended, without organomegaly, normal bowel sounds. EXTREMITIES: No cyanosis, edema, or swelling. NEUROLOGICAL:  Oriented to person, place and time, no gait abnormalities, moves all extremities without gross motor or sensory deficit. NECK: Neck and thyroid normal. SKIN: Skin normal.        Reuben MARLA Burkes, MD  05/03/2024    Contains text generated by [employer].

## 2024-08-02 ENCOUNTER — Ambulatory Visit: Admitting: Physician Assistant

## 2024-08-03 ENCOUNTER — Ambulatory Visit

## 2025-05-05 ENCOUNTER — Encounter
